# Patient Record
Sex: Male | Born: 1940 | Race: White | Hispanic: No | Marital: Married | Smoking: Never smoker
Health system: Southern US, Community
[De-identification: ages and names within clinical notes are randomized; demographics above are authoritative.]

## PROBLEM LIST (undated history)

## (undated) DIAGNOSIS — N189 Chronic kidney disease, unspecified: Secondary | ICD-10-CM

## (undated) DIAGNOSIS — M109 Gout, unspecified: Secondary | ICD-10-CM

## (undated) DIAGNOSIS — Z950 Presence of cardiac pacemaker: Secondary | ICD-10-CM

## (undated) DIAGNOSIS — E119 Type 2 diabetes mellitus without complications: Secondary | ICD-10-CM

## (undated) DIAGNOSIS — G9340 Encephalopathy, unspecified: Secondary | ICD-10-CM

## (undated) DIAGNOSIS — I519 Heart disease, unspecified: Secondary | ICD-10-CM

## (undated) HISTORY — PX: APPENDECTOMY: SHX54

## (undated) HISTORY — PX: TONSILLECTOMY: SUR1361

## (undated) HISTORY — PX: CARDIOVERSION: SHX1299

## (undated) HISTORY — PX: PACEMAKER PLACEMENT: SHX43

## (undated) HISTORY — PX: TOTAL HIP ARTHROPLASTY: SHX124

---

## 2018-06-20 ENCOUNTER — Emergency Department: Payer: Medicare Other

## 2018-06-20 ENCOUNTER — Encounter: Payer: Self-pay | Admitting: Emergency Medicine

## 2018-06-20 ENCOUNTER — Emergency Department
Admission: EM | Admit: 2018-06-20 | Discharge: 2018-06-20 | Disposition: A | Discharge status: Rehabilitation Facility | Payer: Medicare Other | Source: Home / Self Care | Attending: Emergency Medicine | Admitting: Emergency Medicine

## 2018-06-20 ENCOUNTER — Other Ambulatory Visit: Payer: Self-pay

## 2018-06-20 DIAGNOSIS — W06XXXA Fall from bed, initial encounter: Secondary | ICD-10-CM

## 2018-06-20 DIAGNOSIS — I129 Hypertensive chronic kidney disease with stage 1 through stage 4 chronic kidney disease, or unspecified chronic kidney disease: Secondary | ICD-10-CM | POA: Insufficient documentation

## 2018-06-20 DIAGNOSIS — Z950 Presence of cardiac pacemaker: Secondary | ICD-10-CM

## 2018-06-20 DIAGNOSIS — M545 Low back pain, unspecified: Secondary | ICD-10-CM

## 2018-06-20 DIAGNOSIS — Y939 Activity, unspecified: Secondary | ICD-10-CM

## 2018-06-20 DIAGNOSIS — Z23 Encounter for immunization: Secondary | ICD-10-CM | POA: Insufficient documentation

## 2018-06-20 DIAGNOSIS — S7002XA Contusion of left hip, initial encounter: Secondary | ICD-10-CM | POA: Insufficient documentation

## 2018-06-20 DIAGNOSIS — Y929 Unspecified place or not applicable: Secondary | ICD-10-CM

## 2018-06-20 DIAGNOSIS — Y999 Unspecified external cause status: Secondary | ICD-10-CM | POA: Insufficient documentation

## 2018-06-20 DIAGNOSIS — Z7901 Long term (current) use of anticoagulants: Secondary | ICD-10-CM

## 2018-06-20 DIAGNOSIS — Z96649 Presence of unspecified artificial hip joint: Secondary | ICD-10-CM

## 2018-06-20 DIAGNOSIS — W19XXXA Unspecified fall, initial encounter: Secondary | ICD-10-CM

## 2018-06-20 DIAGNOSIS — G9340 Encephalopathy, unspecified: Secondary | ICD-10-CM

## 2018-06-20 DIAGNOSIS — E1122 Type 2 diabetes mellitus with diabetic chronic kidney disease: Secondary | ICD-10-CM

## 2018-06-20 DIAGNOSIS — K7290 Hepatic failure, unspecified without coma: Secondary | ICD-10-CM | POA: Diagnosis not present

## 2018-06-20 DIAGNOSIS — N189 Chronic kidney disease, unspecified: Secondary | ICD-10-CM

## 2018-06-20 HISTORY — DX: Heart disease, unspecified: I51.9

## 2018-06-20 HISTORY — DX: Type 2 diabetes mellitus without complications: E11.9

## 2018-06-20 HISTORY — DX: Gout, unspecified: M10.9

## 2018-06-20 HISTORY — DX: Encephalopathy, unspecified: G93.40

## 2018-06-20 HISTORY — DX: Chronic kidney disease, unspecified: N18.9

## 2018-06-20 HISTORY — DX: Presence of cardiac pacemaker: Z95.0

## 2018-06-20 MED ORDER — TETANUS-DIPHTH-ACELL PERTUSSIS 5-2.5-18.5 LF-MCG/0.5 IM SUSP
0.5000 mL | Freq: Once | INTRAMUSCULAR | Status: AC
  Administered 2018-06-20: 0.5 mL via INTRAMUSCULAR
  Filled 2018-06-20: qty 0.5

## 2018-06-20 NOTE — ED Provider Notes (Signed)
[facility] Emergency Department Provider Note  ____________________________________________   First MD Initiated Contact with Patient 06/20/18 1739     (approximate)  I have reviewed the triage vital signs and the nursing notes.   HISTORY  Chief Complaint Fall   HPI Leonard Welch is a 77 y.o. male with a history of atrial fibrillation on Coumadin who was presented to emergency department after fall.  He says that he was positioning himself on his bed when he slipped off the end of the mattress falling backwards into a door.  Says that he hit his left lower back as well as his right elbow.  Denies hitting his head but patient does take Eliquis.  Denies loss of consciousness.  Does not report any neck pain.  Patient states that he is due for tetanus shot.   Past Medical History:  Diagnosis Date  . Cardiac pacemaker   . CKD (chronic kidney disease)   . Diabetes mellitus without complication (HCC)   . Encephalopathy   . Gout   . Heart disease     There are no active problems to display for this patient.   Past Surgical History:  Procedure Laterality Date  . APPENDECTOMY    . CARDIOVERSION    . PACEMAKER PLACEMENT    . TONSILLECTOMY    . TOTAL HIP ARTHROPLASTY Right     Prior to Admission medications   Not on File    Allergies Amiodarone and Warfarin and related  History reviewed. No pertinent family history.  Social History Social History   Tobacco Use  . Smoking status: Never Smoker  . Smokeless tobacco: Never Used  Substance Use Topics  . Alcohol use: Yes  . Drug use: Never    Review of Systems  Constitutional: No fever/chills Eyes: No visual changes. ENT: No sore throat. Cardiovascular: Denies chest pain. Respiratory: Denies shortness of breath. Gastrointestinal: No abdominal pain.  No nausea, no vomiting.  No diarrhea.  No constipation. Genitourinary: Negative for dysuria. Musculoskeletal: As above Skin: Negative for  rash. Neurological: Negative for headaches, focal weakness or numbness.   ____________________________________________   PHYSICAL EXAM:  VITAL SIGNS: ED Triage Vitals  Enc Vitals Group     BP 06/20/18 1742 116/79     Pulse Rate 06/20/18 1742 98     Resp 06/20/18 1742 17     Temp 06/20/18 1742 97.7 F (36.5 C)     Temp Source 06/20/18 1742 Oral     SpO2 06/20/18 1742 98 %     Weight 06/20/18 1744 222 lb (100.7 kg)     Height 06/20/18 1744 5\' 10"  (1.778 m)     Head Circumference --      Peak Flow --      Pain Score 06/20/18 1743 8     Pain Loc --      Pain Edu? --      Excl. in GC? --     Constitutional: Alert and oriented. Well appearing and in no acute distress. Eyes: Conjunctivae are normal.  Head: Atraumatic. Nose: No congestion/rhinnorhea. Mouth/Throat: Mucous membranes are moist.  Neck: No stridor.  Patient without any tenderness palpation of the midline cervical spine.  No deformity or step-off. Cardiovascular: Normal rate, regular rhythm. Grossly normal heart sounds.  Respiratory: Normal respiratory effort.  No retractions. Lungs CTAB. Gastrointestinal: Soft and nontender. No distention. No CVA tenderness. Musculoskeletal: Mild tenderness to palpation on the posterior lateral aspect of the iliac crest on the left side.  However, there  is no midline lumbar nor thoracic tenderness to palpation.  No deformity or step-off.  Pelvis is stable.  5 out of 5 strength in bilateral lower extremities.  No hip tenderness the bilateral extremities.  No deformities.  Moderate bilateral lower extremity edema which the patient says is chronic with multiple areas of abrasion which patient says is also chronic.  Right elbow with full, active, range of motion without any swelling or tenderness to palpation.  Mild abrasion to the right lateral dorsum of the elbow without any acute/active bleeding.  No surrounding erythema. Neurologic:  Normal speech and language. No gross focal neurologic  deficits are appreciated. Skin:  Skin is warm, dry and intact. No rash noted. Psychiatric: Mood and affect are normal. Speech and behavior are normal.  ____________________________________________   LABS (all labs ordered are listed, but only abnormal results are displayed)  Labs Reviewed - No data to display ____________________________________________  EKG  ED ECG REPORT I, Arelia Longest, the attending physician, personally viewed and interpreted this ECG.   Date: 06/20/2018  EKG Time: 1741  Rate: 96  Rhythm: atrial fibrillation, rate 96  Axis: Right  Intervals:none  ST&T Change: No ST segment elevation or depression.  T wave inversions in 2, 3 and aVF as well as V5 and V6. No significant change from previous. ____________________________________________  RADIOLOGY  No acute finding of the CT head.  Possible subcapital left-sided hip fracture on x-ray.  However, CT of the hip only shows arthritis. ____________________________________________   PROCEDURES  Procedure(s) performed:   Procedures  Critical Care performed:   ____________________________________________   INITIAL IMPRESSION / ASSESSMENT AND PLAN / ED COURSE  Pertinent labs & imaging results that were available during my care of the patient were reviewed by me and considered in my medical decision making (see chart for details).  DDX: Hip contusion, hip fracture, abrasion, elbow contusion, laceration, intracranial hemorrhage, skull fracture, concussion, pelvic fracture No previous records on file for review.  ----------------------------------------- 8:56 PM on 06/20/2018 -----------------------------------------  Patient at this time hemodynamically stable with pulse rate that is slightly decreased as well as a normal blood pressure.  Unlikely to have internal bleeding.  Reassuring imaging.  Patient to be discharged at this time.  Updated his wife as well as the patient regarding the diagnosis  and treatment plan. ____________________________________________   FINAL CLINICAL IMPRESSION(S) / ED DIAGNOSES  Fall.  Low back pain.  Hip contusion.  NEW MEDICATIONS STARTED DURING THIS VISIT:  New Prescriptions   No medications on file     Note:  This document was prepared using Dragon voice recognition software and may include unintentional dictation errors.     Myrna Blazer, MD 06/20/18 2056

## 2018-06-20 NOTE — ED Notes (Signed)
Patient transported to CT 

## 2018-06-20 NOTE — ED Notes (Signed)
Signature pad not working.  Unable to obtain patient discharge signature but verbalizes understanding and has no further questions.

## 2018-06-20 NOTE — ED Triage Notes (Signed)
Pt to ED via EMS from [facility] c/o unwitnessed fall today, patient states was transferring from wheelchair to bed when the wheelchair went backwards causing patient to fall into a door hitting his back and left hip.  Denies hitting head or LOC, presents A&Ox4, skin tears to bilateral arms.  EMS vitals 111/44 BP, 90 HR, 183 CBG.

## 2018-06-20 NOTE — ED Notes (Signed)
Pt given water 

## 2018-06-20 NOTE — ED Notes (Signed)
Patient's bilateral arms wrapped in non-adherent dressing and guaze.

## 2018-06-21 ENCOUNTER — Encounter: Payer: Self-pay | Admitting: Emergency Medicine

## 2018-06-21 ENCOUNTER — Inpatient Hospital Stay: Payer: Medicare Other

## 2018-06-21 ENCOUNTER — Inpatient Hospital Stay
Admission: EM | Admit: 2018-06-21 | Discharge: 2018-06-23 | DRG: 442 | Disposition: A | Discharge status: Other Acute Inpatient Hospital | Payer: Medicare Other | Attending: Internal Medicine | Admitting: Internal Medicine

## 2018-06-21 ENCOUNTER — Other Ambulatory Visit: Payer: Self-pay

## 2018-06-21 ENCOUNTER — Emergency Department: Payer: Medicare Other

## 2018-06-21 DIAGNOSIS — E1165 Type 2 diabetes mellitus with hyperglycemia: Secondary | ICD-10-CM | POA: Diagnosis present

## 2018-06-21 DIAGNOSIS — Z96641 Presence of right artificial hip joint: Secondary | ICD-10-CM | POA: Diagnosis present

## 2018-06-21 DIAGNOSIS — R29898 Other symptoms and signs involving the musculoskeletal system: Secondary | ICD-10-CM | POA: Diagnosis present

## 2018-06-21 DIAGNOSIS — Z515 Encounter for palliative care: Secondary | ICD-10-CM

## 2018-06-21 DIAGNOSIS — W06XXXA Fall from bed, initial encounter: Secondary | ICD-10-CM | POA: Diagnosis present

## 2018-06-21 DIAGNOSIS — E860 Dehydration: Secondary | ICD-10-CM | POA: Diagnosis present

## 2018-06-21 DIAGNOSIS — H53462 Homonymous bilateral field defects, left side: Secondary | ICD-10-CM | POA: Diagnosis present

## 2018-06-21 DIAGNOSIS — Z23 Encounter for immunization: Secondary | ICD-10-CM

## 2018-06-21 DIAGNOSIS — I69354 Hemiplegia and hemiparesis following cerebral infarction affecting left non-dominant side: Secondary | ICD-10-CM

## 2018-06-21 DIAGNOSIS — Z79899 Other long term (current) drug therapy: Secondary | ICD-10-CM

## 2018-06-21 DIAGNOSIS — E871 Hypo-osmolality and hyponatremia: Secondary | ICD-10-CM | POA: Diagnosis present

## 2018-06-21 DIAGNOSIS — Z7901 Long term (current) use of anticoagulants: Secondary | ICD-10-CM | POA: Diagnosis not present

## 2018-06-21 DIAGNOSIS — I4891 Unspecified atrial fibrillation: Secondary | ICD-10-CM | POA: Diagnosis present

## 2018-06-21 DIAGNOSIS — S0181XA Laceration without foreign body of other part of head, initial encounter: Secondary | ICD-10-CM | POA: Diagnosis present

## 2018-06-21 DIAGNOSIS — R627 Adult failure to thrive: Secondary | ICD-10-CM | POA: Diagnosis not present

## 2018-06-21 DIAGNOSIS — Y92092 Bedroom in other non-institutional residence as the place of occurrence of the external cause: Secondary | ICD-10-CM | POA: Diagnosis not present

## 2018-06-21 DIAGNOSIS — Z66 Do not resuscitate: Secondary | ICD-10-CM | POA: Diagnosis not present

## 2018-06-21 DIAGNOSIS — R4182 Altered mental status, unspecified: Secondary | ICD-10-CM

## 2018-06-21 DIAGNOSIS — I130 Hypertensive heart and chronic kidney disease with heart failure and stage 1 through stage 4 chronic kidney disease, or unspecified chronic kidney disease: Secondary | ICD-10-CM | POA: Diagnosis present

## 2018-06-21 DIAGNOSIS — I5042 Chronic combined systolic (congestive) and diastolic (congestive) heart failure: Secondary | ICD-10-CM | POA: Diagnosis present

## 2018-06-21 DIAGNOSIS — R410 Disorientation, unspecified: Secondary | ICD-10-CM | POA: Diagnosis not present

## 2018-06-21 DIAGNOSIS — K7290 Hepatic failure, unspecified without coma: Secondary | ICD-10-CM | POA: Diagnosis present

## 2018-06-21 DIAGNOSIS — I2510 Atherosclerotic heart disease of native coronary artery without angina pectoris: Secondary | ICD-10-CM | POA: Diagnosis present

## 2018-06-21 DIAGNOSIS — Z79890 Hormone replacement therapy (postmenopausal): Secondary | ICD-10-CM

## 2018-06-21 DIAGNOSIS — N183 Chronic kidney disease, stage 3 (moderate): Secondary | ICD-10-CM | POA: Diagnosis present

## 2018-06-21 DIAGNOSIS — Z950 Presence of cardiac pacemaker: Secondary | ICD-10-CM

## 2018-06-21 DIAGNOSIS — N179 Acute kidney failure, unspecified: Secondary | ICD-10-CM | POA: Diagnosis present

## 2018-06-21 DIAGNOSIS — R0989 Other specified symptoms and signs involving the circulatory and respiratory systems: Secondary | ICD-10-CM

## 2018-06-21 DIAGNOSIS — E1122 Type 2 diabetes mellitus with diabetic chronic kidney disease: Secondary | ICD-10-CM | POA: Diagnosis present

## 2018-06-21 DIAGNOSIS — Z794 Long term (current) use of insulin: Secondary | ICD-10-CM | POA: Diagnosis not present

## 2018-06-21 DIAGNOSIS — E039 Hypothyroidism, unspecified: Secondary | ICD-10-CM | POA: Diagnosis present

## 2018-06-21 DIAGNOSIS — K7682 Hepatic encephalopathy: Secondary | ICD-10-CM

## 2018-06-21 DIAGNOSIS — E878 Other disorders of electrolyte and fluid balance, not elsewhere classified: Secondary | ICD-10-CM | POA: Diagnosis present

## 2018-06-21 DIAGNOSIS — R531 Weakness: Secondary | ICD-10-CM

## 2018-06-21 LAB — PROTIME-INR
INR: 1.33
PROTHROMBIN TIME: 16.4 s — AB (ref 11.4–15.2)

## 2018-06-21 LAB — GLUCOSE, CAPILLARY
GLUCOSE-CAPILLARY: 136 mg/dL — AB (ref 70–99)
Glucose-Capillary: 152 mg/dL — ABNORMAL HIGH (ref 70–99)
Glucose-Capillary: 127 mg/dL — ABNORMAL HIGH (ref 70–99)
Glucose-Capillary: 147 mg/dL — ABNORMAL HIGH (ref 70–99)

## 2018-06-21 LAB — MRSA PCR SCREENING: MRSA by PCR: POSITIVE — AB

## 2018-06-21 LAB — COMPREHENSIVE METABOLIC PANEL
ALT: 21 U/L (ref 0–44)
ANION GAP: 14 (ref 5–15)
AST: 45 U/L — ABNORMAL HIGH (ref 15–41)
Albumin: 3.6 g/dL (ref 3.5–5.0)
Alkaline Phosphatase: 224 U/L — ABNORMAL HIGH (ref 38–126)
BUN: 32 mg/dL — ABNORMAL HIGH (ref 8–23)
CALCIUM: 10 mg/dL (ref 8.9–10.3)
CHLORIDE: 97 mmol/L — AB (ref 98–111)
CO2: 23 mmol/L (ref 22–32)
CREATININE: 2.14 mg/dL — AB (ref 0.61–1.24)
GFR calc non Af Amer: 28 mL/min — ABNORMAL LOW (ref >60)
GFR, EST AFRICAN AMERICAN: 33 mL/min — AB (ref >60)
Glucose, Bld: 176 mg/dL — ABNORMAL HIGH (ref 70–99)
Potassium: 4.6 mmol/L (ref 3.5–5.1)
SODIUM: 134 mmol/L — AB (ref 135–145)
Total Bilirubin: 2.2 mg/dL — ABNORMAL HIGH (ref 0.3–1.2)
Total Protein: 7.9 g/dL (ref 6.5–8.1)

## 2018-06-21 LAB — URINALYSIS, COMPLETE (UACMP) WITH MICROSCOPIC
BILIRUBIN URINE: NEGATIVE
Glucose, UA: NEGATIVE mg/dL
Ketones, ur: NEGATIVE mg/dL
Nitrite: NEGATIVE
Protein, ur: NEGATIVE mg/dL
SPECIFIC GRAVITY, URINE: 1.013 (ref 1.005–1.030)
pH: 5 (ref 5.0–8.0)

## 2018-06-21 LAB — VITAMIN B12: VITAMIN B 12: 528 pg/mL (ref 180–914)

## 2018-06-21 LAB — CBC
HCT: 44.2 % (ref 40.0–52.0)
Hemoglobin: 15.3 g/dL (ref 13.0–18.0)
MCH: 32.4 pg (ref 26.0–34.0)
MCHC: 34.7 g/dL (ref 32.0–36.0)
MCV: 93.3 fL (ref 80.0–100.0)
PLATELETS: 201 10*3/uL (ref 150–440)
RBC: 4.73 MIL/uL (ref 4.40–5.90)
RDW: 16.9 % — ABNORMAL HIGH (ref 11.5–14.5)
WBC: 10.1 10*3/uL (ref 3.8–10.6)

## 2018-06-21 LAB — PHOSPHORUS: PHOSPHORUS: 2.9 mg/dL (ref 2.5–4.6)

## 2018-06-21 LAB — MAGNESIUM: MAGNESIUM: 2.2 mg/dL (ref 1.7–2.4)

## 2018-06-21 LAB — AMMONIA: Ammonia: 36 umol/L — ABNORMAL HIGH (ref 9–35)

## 2018-06-21 LAB — APTT: aPTT: 41 seconds — ABNORMAL HIGH (ref 24–36)

## 2018-06-21 LAB — FOLATE: FOLATE: 10.3 ng/mL (ref >5.9)

## 2018-06-21 LAB — TSH: TSH: 1.108 u[IU]/mL (ref 0.350–4.500)

## 2018-06-21 MED ORDER — SENNOSIDES-DOCUSATE SODIUM 8.6-50 MG PO TABS: 1.0000 | ORAL_TABLET | Freq: Every evening | ORAL | Status: DC | PRN

## 2018-06-21 MED ORDER — ONDANSETRON HCL 4 MG PO TABS: 4.0000 mg | ORAL_TABLET | Freq: Four times a day (QID) | ORAL | Status: DC | PRN

## 2018-06-21 MED ORDER — LORAZEPAM 2 MG/ML IJ SOLN
1.0000 mg | Freq: Once | INTRAMUSCULAR | Status: AC
  Administered 2018-06-21: 1 mg via INTRAVENOUS

## 2018-06-21 MED ORDER — BACITRACIN 500 UNIT/GM EX OINT
TOPICAL_OINTMENT | Freq: Two times a day (BID) | CUTANEOUS | Status: DC
  Filled 2018-06-21 (×7): qty 14

## 2018-06-21 MED ORDER — ONDANSETRON HCL 4 MG/2ML IJ SOLN: 4.0000 mg | Freq: Four times a day (QID) | INTRAMUSCULAR | Status: DC | PRN

## 2018-06-21 MED ORDER — SPIRONOLACTONE 25 MG PO TABS
25.0000 mg | ORAL_TABLET | Freq: Every day | ORAL | Status: DC
  Administered 2018-06-21 – 2018-06-22 (×2): 25 mg via ORAL
  Filled 2018-06-21 (×2): qty 1

## 2018-06-21 MED ORDER — LACTULOSE 10 GM/15ML PO SOLN
30.0000 g | Freq: Three times a day (TID) | ORAL | Status: DC
  Administered 2018-06-21 – 2018-06-22 (×6): 30 g via ORAL
  Filled 2018-06-21 (×6): qty 60

## 2018-06-21 MED ORDER — INSULIN ASPART 100 UNIT/ML ~~LOC~~ SOLN: 0.0000 [IU] | Freq: Every day | SUBCUTANEOUS | Status: DC

## 2018-06-21 MED ORDER — INSULIN GLARGINE 100 UNIT/ML ~~LOC~~ SOLN
18.0000 [IU] | Freq: Every day | SUBCUTANEOUS | Status: DC
  Administered 2018-06-21 – 2018-06-22 (×2): 18 [IU] via SUBCUTANEOUS
  Filled 2018-06-21 (×2): qty 0.18

## 2018-06-21 MED ORDER — MORPHINE SULFATE (PF) 2 MG/ML IV SOLN
2.0000 mg | INTRAVENOUS | Status: DC | PRN
  Administered 2018-06-21 – 2018-06-22 (×5): 2 mg via INTRAVENOUS
  Filled 2018-06-21 (×6): qty 1

## 2018-06-21 MED ORDER — SODIUM CHLORIDE 0.9 % IV BOLUS
1000.0000 mL | Freq: Once | INTRAVENOUS | Status: AC
  Administered 2018-06-21: 1000 mL via INTRAVENOUS

## 2018-06-21 MED ORDER — APIXABAN 2.5 MG PO TABS
2.5000 mg | ORAL_TABLET | Freq: Two times a day (BID) | ORAL | Status: DC
  Administered 2018-06-21 – 2018-06-22 (×3): 2.5 mg via ORAL
  Filled 2018-06-21 (×3): qty 1

## 2018-06-21 MED ORDER — LORAZEPAM 2 MG/ML IJ SOLN
INTRAMUSCULAR | Status: AC
  Administered 2018-06-21: 1 mg via INTRAVENOUS
  Filled 2018-06-21: qty 1

## 2018-06-21 MED ORDER — BUMETANIDE 1 MG PO TABS
2.0000 mg | ORAL_TABLET | Freq: Every day | ORAL | Status: DC
  Administered 2018-06-21 – 2018-06-22 (×2): 2 mg via ORAL
  Filled 2018-06-21 (×3): qty 2

## 2018-06-21 MED ORDER — LACTULOSE 10 GM/15ML PO SOLN
30.0000 g | Freq: Once | ORAL | Status: DC
  Filled 2018-06-21: qty 60

## 2018-06-21 MED ORDER — METOPROLOL TARTRATE 25 MG PO TABS
25.0000 mg | ORAL_TABLET | Freq: Two times a day (BID) | ORAL | Status: DC
  Administered 2018-06-21 – 2018-06-22 (×3): 25 mg via ORAL
  Filled 2018-06-21 (×4): qty 1

## 2018-06-21 MED ORDER — LEVOTHYROXINE SODIUM 50 MCG PO TABS
175.0000 ug | ORAL_TABLET | Freq: Every day | ORAL | Status: DC
  Administered 2018-06-21 – 2018-06-22 (×2): 175 ug via ORAL
  Filled 2018-06-21 (×2): qty 1

## 2018-06-21 MED ORDER — INFLUENZA VAC SPLIT HIGH-DOSE 0.5 ML IM SUSY
0.5000 mL | PREFILLED_SYRINGE | INTRAMUSCULAR | Status: AC
  Administered 2018-06-22: 0.5 mL via INTRAMUSCULAR
  Filled 2018-06-21: qty 0.5

## 2018-06-21 MED ORDER — POLYETHYLENE GLYCOL 3350 17 G PO PACK
17.0000 g | PACK | Freq: Every day | ORAL | Status: DC
  Administered 2018-06-22: 17 g via ORAL
  Filled 2018-06-21: qty 1

## 2018-06-21 MED ORDER — INSULIN ASPART 100 UNIT/ML ~~LOC~~ SOLN
0.0000 [IU] | Freq: Three times a day (TID) | SUBCUTANEOUS | Status: DC
  Administered 2018-06-21: 3 [IU] via SUBCUTANEOUS
  Administered 2018-06-21: 2 [IU] via SUBCUTANEOUS
  Administered 2018-06-22: 3 [IU] via SUBCUTANEOUS
  Filled 2018-06-21 (×3): qty 1

## 2018-06-21 MED ORDER — SULFAMETHOXAZOLE-TRIMETHOPRIM 800-160 MG PO TABS
1.0000 | ORAL_TABLET | Freq: Two times a day (BID) | ORAL | Status: DC
  Administered 2018-06-21 – 2018-06-22 (×3): 1 via ORAL
  Filled 2018-06-21 (×5): qty 1

## 2018-06-21 MED ORDER — BISACODYL 5 MG PO TBEC: 5.0000 mg | DELAYED_RELEASE_TABLET | Freq: Every day | ORAL | Status: DC | PRN

## 2018-06-21 MED ORDER — PRAVASTATIN SODIUM 20 MG PO TABS
10.0000 mg | ORAL_TABLET | Freq: Every day | ORAL | Status: DC
  Administered 2018-06-22: 10 mg via ORAL
  Filled 2018-06-21 (×2): qty 1

## 2018-06-21 NOTE — ED Notes (Signed)
This RN to bedside to collect PT/INR. Admitting MD at bedside with patient's wife. Pt continues to be disoriented at this time. Pt repeatedly calling out, "move me over, help me Doc". Pt remains disoriented x 4, pt with dried blood noted to nose and forehead at this time.

## 2018-06-21 NOTE — Progress Notes (Signed)
[employer] [facility] [place] [facility]   PATIENT NAME: Leonard Welch    MR#:  [id number]  DATE OF BIRTH:  1941-04-09  SUBJECTIVE:  CHIEF COMPLAINT:   Chief Complaint  Patient presents with  . Fall  . Altered Mental Status  . Laceration   For last few months patient had multiple admissions at [facility] due to altered mental status and weakness issues with at one point he was suspected to have high ammonia and hepatic encephalopathy but although infection work-up and liver work-ups were negative and patient's symptoms persisted an MRI had reported having some old strokes.  At that and they were not able to find any clear diagnosis as per patient's wife who is present in the room.  They did sent him to the assisted living place nearby where he was more confused and had a fall and sent to our emergency room last night. Patient is completely confused and as per wife he is not able to move both upper limbs now. REVIEW OF SYSTEMS:   Due to confusion patient is not able to give review of system.  ROS  DRUG ALLERGIES:   Allergies  Allergen Reactions  . Amiodarone   . Warfarin And Related     VITALS:  Blood pressure 113/72, pulse 82, temperature 98.5 F (36.9 C), temperature source Oral, resp. rate 18, height 5\' 10"  (1.778 m), weight 100.6 kg, SpO2 97 %.  PHYSICAL EXAMINATION:   GENERAL:  77 y.o.-year-old patient lying in the bed very ill-appearing with multiple skin lesions and lacerations of different age.  EYES: Pupils equal, round, reactive to light and accommodation. No scleral icterus. Extraocular muscles intact.  HEENT: Head have laceration on forehead, normocephalic. Oropharynx and nasopharynx clear.  NECK:  Supple, no jugular venous distention. No thyroid enlargement, no tenderness.  LUNGS: Normal breath sounds bilaterally, no wheezing, rales,rhonchi or crepitation. No use of accessory muscles of respiration.  CARDIOVASCULAR: S1, S2 normal. No murmurs, rubs,  or gallops.  ABDOMEN: Soft, nontender, nondistended. Bowel sounds present. No organomegaly or mass.  EXTREMITIES: No pedal edema, bilateral toe redness/ cyanosis, palpable pulses. NEUROLOGIC: Patient is disoriented, moves both lower extremities up to 3 out of 5 power, his both upper extremities are weak with 0 out of 5 power and no response to painful stimuli.  He is awake and looking at me and speaking some words with no meaning and not communicating or following any commands. PSYCHIATRIC: The patient is confused and disoriented.  SKIN: Multiple injury marks, lacerations of different age and timeframe.   Physical Exam LABORATORY PANEL:   CBC Recent Labs  Lab 06/21/18 0525  WBC 10.1  HGB 15.3  HCT 44.2  PLT 201   ------------------------------------------------------------------------------------------------------------------  Chemistries  Recent Labs  Lab 06/21/18 0525 06/21/18 0727  NA 134*  --   K 4.6  --   CL 97*  --   CO2 23  --   GLUCOSE 176*  --   BUN 32*  --   CREATININE 2.14*  --   CALCIUM 10.0  --   MG  --  2.2  AST 45*  --   ALT 21  --   ALKPHOS 224*  --   BILITOT 2.2*  --    ------------------------------------------------------------------------------------------------------------------  Cardiac Enzymes No results for input(s): TROPONINI in the last 168 hours. ------------------------------------------------------------------------------------------------------------------  RADIOLOGY:  Dg Chest 1 View  Result Date: 06/21/2018 CLINICAL DATA:  Acute mental status changes with confusion. Indwelling cardiac pacemaker. Current history of chronic kidney  disease and diabetes. EXAM: Portable CHEST 1 VIEW COMPARISON:  None. FINDINGS: Cardiac silhouette markedly enlarged even allowing for AP portable technique. Thoracic aorta atherosclerotic. Hilar and mediastinal contours otherwise unremarkable. LEFT subclavian single lead transvenous pacemaker intact. Mild  pulmonary venous hypertension without overt edema. Elevation of the LEFT hemidiaphragm with mild atelectasis at the LEFT lung base. Lungs otherwise clear. No visible pleural effusions. IMPRESSION: 1. Mild LEFT basilar atelectasis with elevation of the LEFT hemidiaphragm. No acute cardiopulmonary disease otherwise. 2. Marked cardiomegaly. Mild pulmonary venous hypertension without evidence of overt pulmonary edema currently. Electronically Signed   By: Hulan Saas M.D.   On: 06/21/2018 13:45   Ct Head Wo Contrast  Result Date: 06/21/2018 CLINICAL DATA:  77 y/o M; fall with laceration to the head, confusion, altered mental status. EXAM: CT HEAD WITHOUT CONTRAST CT CERVICAL SPINE WITHOUT CONTRAST TECHNIQUE: Multidetector CT imaging of the head and cervical spine was performed following the standard protocol without intravenous contrast. Multiplanar CT image reconstructions of the cervical spine were also generated. COMPARISON:  06/20/2018 CT head. FINDINGS: CT HEAD FINDINGS Brain: No evidence of acute infarction, hemorrhage, hydrocephalus, extra-axial collection or mass lesion/mass effect. Right occipital lobe chronic infarction. Very small chronic infarction in right frontal white matter. Moderate volume loss of the brain. Vascular: Calcific atherosclerosis of carotid siphons. Skull: Mild left frontal scalp contusion. No calvarial fracture identified. Sinuses/Orbits: No acute finding. Other: None. CT CERVICAL SPINE FINDINGS Alignment: Normal. Skull base and vertebrae: No acute fracture. No primary bone lesion or focal pathologic process. Soft tissues and spinal canal: Postsurgical changes within the left anterior neck. Disc levels: Multilevel disc and facet degenerative changes prominent endplate marginal osteophytes greatest at C6-7. Uncovertebral and facet hypertrophy results in bilateral C3-4, left C4-5, bilateral C5-6, bilateral C6-7 foraminal stenosis. Multilevel mild to moderate canal stenosis. Upper  chest: Calcified granulomas in the lung apices. Other: Negative. IMPRESSION: 1. Mild left frontal scalp contusion. No calvarial fracture. 2. No acute intracranial abnormality identified. 3. No acute fracture or dislocation of the cervical spine identified. 4. 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 5. Moderate cervical spondylosis greatest at C6-7 level. Electronically Signed   By: Mitzi Hansen M.D.   On: 06/21/2018 05:56   Ct Head Wo Contrast  Result Date: 06/20/2018 CLINICAL DATA:  Unwitnessed fall, trauma EXAM: CT HEAD WITHOUT CONTRAST TECHNIQUE: Contiguous axial images were obtained from the base of the skull through the vertex without intravenous contrast. COMPARISON:  None. FINDINGS: Brain: Brain atrophy noted with chronic white matter microvascular changes throughout both cerebral hemispheres. Medial right occipital lobe encephalomalacia posteriorly compatible with a previous right PCA territory infarct. No acute intracranial hemorrhage, new mass lesion, new infarction, midline shift, herniation, hydrocephalus, or extra-axial fluid collection. No focal mass effect or edema. Cisterns are patent. Cerebellar atrophy as well. Vascular: Intracranial atherosclerosis noted.  No hyperdense vessel. Skull: Normal. Negative for fracture or focal lesion. Sinuses/Orbits: No acute finding. Other: None. IMPRESSION: Atrophy and chronic white matter microvascular changes. Remote right occipital PCA territory infarct No acute intracranial abnormality by noncontrast CT. Electronically Signed   By: Judie Petit.  Shick M.D.   On: 06/20/2018 18:51   Ct Cervical Spine Wo Contrast  Result Date: 06/21/2018 CLINICAL DATA:  77 y/o M; fall with laceration to the head, confusion, altered mental status. EXAM: CT HEAD WITHOUT CONTRAST CT CERVICAL SPINE WITHOUT CONTRAST TECHNIQUE: Multidetector CT imaging of the head and cervical spine was performed following the standard protocol without  intravenous contrast.  Multiplanar CT image reconstructions of the cervical spine were also generated. COMPARISON:  06/20/2018 CT head. FINDINGS: CT HEAD FINDINGS Brain: No evidence of acute infarction, hemorrhage, hydrocephalus, extra-axial collection or mass lesion/mass effect. Right occipital lobe chronic infarction. Very small chronic infarction in right frontal white matter. Moderate volume loss of the brain. Vascular: Calcific atherosclerosis of carotid siphons. Skull: Mild left frontal scalp contusion. No calvarial fracture identified. Sinuses/Orbits: No acute finding. Other: None. CT CERVICAL SPINE FINDINGS Alignment: Normal. Skull base and vertebrae: No acute fracture. No primary bone lesion or focal pathologic process. Soft tissues and spinal canal: Postsurgical changes within the left anterior neck. Disc levels: Multilevel disc and facet degenerative changes prominent endplate marginal osteophytes greatest at C6-7. Uncovertebral and facet hypertrophy results in bilateral C3-4, left C4-5, bilateral C5-6, bilateral C6-7 foraminal stenosis. Multilevel mild to moderate canal stenosis. Upper chest: Calcified granulomas in the lung apices. Other: Negative. IMPRESSION: 1. Mild left frontal scalp contusion. No calvarial fracture. 2. No acute intracranial abnormality identified. 3. No acute fracture or dislocation of the cervical spine identified. 4. 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 5. Moderate cervical spondylosis greatest at C6-7 level. Electronically Signed   By: Mitzi Hansen M.D.   On: 06/21/2018 05:56   Ct Hip Left Wo Contrast  Result Date: 06/20/2018 CLINICAL DATA:  Pt to ED via EMS from [facility] c/o unwitnessed fall today, patient states was transferring from wheelchair to bed when the wheelchair went backwards causing patient to fall into a door hitting his back and left hip. Denies hitting head or LOC EXAM: CT OF THE LEFT HIP WITHOUT  CONTRAST TECHNIQUE: Multidetector CT imaging of the left hip was performed according to the standard protocol. Multiplanar CT image reconstructions were also generated. COMPARISON:  Current left hip radiographs. FINDINGS: Bones/Joint/Cartilage No fracture. There is a small collar of osteophytes along the base of the left femoral head accounting for the impression of a possible fracture on the standard radiographs. No bone lesion. Left hip joint is normally aligned. There is concentric hip joint space narrowing with greater narrowing along superolateral joint space. No convincing joint effusion. Ligaments Suboptimally assessed by CT. Muscles and Tendons The muscles and tendons inserting along the proximal appear intact. No muscle contusion or hematoma on the included field. Soft tissues No soft tissue mass or significant contusion or hematoma. No acute findings noted in the visualized portions of the left pelvis. IMPRESSION: 1. No fracture. Specifically, no evidence of a left femoral neck fracture. No acute findings. 2. Degenerative/arthropathic changes of the left hip joint with a small collar osteophytes along the base of the femoral head. Electronically Signed   By: Amie Portland M.D.   On: 06/20/2018 19:54   Dg Hip Unilat W Or Wo Pelvis 2-3 Views Left  Result Date: 06/20/2018 CLINICAL DATA:  Unwitnessed fall today while transferring from the wheelchair to bed with left hip pain. EXAM: DG HIP (WITH OR WITHOUT PELVIS) 2-3V LEFT COMPARISON:  None. FINDINGS: Mild diffuse osteopenia. Mild degenerative change of the left hip. There are subtle findings suggesting a subcapital left femoral neck fracture there are degenerative changes of the spine. Visualized portions of the right hip arthroplasty are intact. IMPRESSION: Subtle findings suggesting left subcapital femoral neck fracture. CT or MRI may be helpful for confirmation. Electronically Signed   By: Elberta Fortis M.D.   On: 06/20/2018 19:02    ASSESSMENT AND  PLAN:   Active Problems:   Hepatic encephalopathy (HCC)  Palliative care by specialist   DNR (do not resuscitate)   Adult failure to thrive  *Acute encephalopathy Likely hepatic encephalopathy Also could be worsening of his dementia or a new stroke  CT scan of the head and cervical spine is negative for any acute internal injuries, had some superficial laceration noted. Patient had some old strokes. Currently he is confused so will not cooperate with MRI.  Also doing an MRI may not help with changing the plan of management.  I had this discussion with the wife and she agrees. We will try to rule out infections.  X-ray chest is negative. We will collect urinalysis.  For borderline ammonia- on lactulose. With new onset bilateral upper limb weakness (as per wife and admitting doctor's note he was trying to pull out the lines when he presented to emergency room) I would do a repeat CT scan of the head as he is on anticoagulation. Also send B12, folate, RPR to rule out other pathologies.  Palliative care consult for recurrent admissions and worsening in condition. I had discussed with patient's wife, if he does not improve much we will have to focus on getting him to the rehab center with palliative care following there.  She also had a concern about transferring him to [facility]. As [facility] had admitted the patient twice in the last few month without much findings, and there would not be any new inputs-I had discussed with wife that he may end up staying on the waiting list and may actually not go there as they would not be new things they might do there which we are not doing here.  *Hyponatremia IV fluids.  *Acute renal failure on CKD stage III Baseline creatinine appears around 1.5 Avoid nephrotoxic medications and monitor tomorrow.  *Chronic systolic congestive heart failure Ejection fraction 35% Currently monitor, no signs of exacerbation at this time.  *Atrial fibrillation On Eliquis  currently.  *Hypothyroidism TSH normal.  All the records are reviewed and case discussed with Care Management/Social Workerr. Management plans discussed with the patient, family and they are in agreement.  CODE STATUS: DNR  TOTAL TIME TAKING CARE OF THIS PATIENT:45  minutes.     POSSIBLE D/C IN 2-3 DAYS, DEPENDING ON CLINICAL CONDITION.   Altamese Dilling M.D on 06/21/2018   Between 7am to 6pm - Pager - [phone]  After 6pm go to [email or web address] - password [email or web address]  [facility]  Office  [phone]  CC: Primary care physician; Barnett Hatter, MD  Note: This dictation was prepared with Dragon dictation along with smaller phrase technology. Any transcriptional errors that result from this process are unintentional.

## 2018-06-21 NOTE — Consult Note (Signed)
WOC Nurse wound consult note Reason for Consult:Bilateral LEs with chronic (>3 month) wounds of extended duration with exacerbation from fall. New wounds on forehead and nose from fall. Wound type: trauma, other LE etiology not known; numerous lesions (circular, dry and scabbed) on bilateral legs and arms. Pressure Injury POA: NA Measurement: Right anterior LE with full thickness wound measuring 3cm x 3.5cm x 0.1cm with moist, pink wound bed. No drainage Left LE with dried serum (scab) obscuring wound bed measuring 7cm x 2cm. Dry. Forehead with partial thickness tissue loss in a 7cm x 4cm area with two lesions each 0.1cm deep. Nose: right bridge measures 2cm x 0.1cm with dried serum obscuring wound bed. Wound bed:As described above Drainage (amount, consistency, odor) As described above Periwound: intact, with lesions as noted above Dressing procedure/placement/frequency: Patients family member described that wounds on legs are dressed daily with a nonadherent gauze dressing. We will implement that care twice daily due to exacerbations related to fall. I will provide pressure redistribution heel boots due to the contestant movement of patients legs and feet due to neuropathy. Conservative orders are placed for care of the facial wounds using xeroform gauze to the forehead and bacitracin ointment to the nose.  Patient is in a low bed for safety.  WOC nursing team will not follow, but will remain available to this patient, the nursing and medical teams.  Please re-consult if needed. Thanks, Ladona Mow, MSN, RN, GNP, [facility]  Pager# [phone]

## 2018-06-21 NOTE — Plan of Care (Signed)

## 2018-06-21 NOTE — ED Notes (Signed)
Attempted to call report at this time, RN unable to take report 

## 2018-06-21 NOTE — ED Notes (Signed)
Call from Mayra, EDT in CT with pt due to pt unable to stay still for scan. Pt not following commands. See [facility] for MD intervention.

## 2018-06-21 NOTE — Consult Note (Signed)
Consultation Note Date: 06/21/2018   Patient Name: Leonard Welch  DOB: 1941/02/26  MRN: [id number]  Age / Sex: 77 y.o., male  PCP: Barnett Hatter, MD Referring Physician: Altamese Dilling, *  Reason for Consultation: Establishing goals of care and Psychosocial/spiritual support  HPI/Patient Profile: 77 y.o. male  admitted on 06/21/2018 with past medical h/o of ESLD, chronic  CHF (EF 35% w/ grade II diastolic dysfxn), Afib (Eliquis), T2IDDM, CVA, Med[employer] PPM, who p/w AMS, fall w/ facial laceration.  Admitted for evaluation and treatment.    Pt is currently agitated and restless.  His wife is  at bedside, states that pt has had neurocognitive decline since 07/2017.  She understands the seriousness of the situation and long term poor prognosis.  Wife reports that the patient has been in and out of facilities for past  5 months.    Family face treatment option decisions, advanced directive decisions and anticipatory car needs.    Clinical Assessment and Goals of Care:  This NP Lorinda Creed reviewed medical records, received report from team, assessed the patient and then meet at [name]  to discuss diagnosis, prognosis, GOC, EOL wishes disposition and options.  Concept of Hospice and Palliative Care were discussed.    A detailed discussion was had today regarding advanced directives.  Concepts specific to code status, artifical feeding and hydration, continued IV antibiotics and rehospitalization was had.  The difference between a aggressive medical intervention path  and a palliative comfort care path for this patient at this time was had.  Values and goals of care important to patient and family were attempted to be elicited.  Family has actually been in contact with a Palliative provider in the past.  Comfort is a priority, however wife's main concern at  this time is finding placement for her husband closer to home in [facility] form completed, placed in hard chart   Natural trajectory and expectations at EOL were discussed.  Questions and concerns addressed.   Family encouraged to call with questions or concerns.    PMT will continue to support holistically.    HCPOA/wife    SUMMARY OF RECOMMENDATIONS    Code Status/Advance Care Planning:  DNR-documented today  Treat the treatable and when medically stable dc closer to home, discussed with SW  Open to hospice "when that time comes"-- discussed eligibility criteria    Palliative Prophylaxis:   Aspiration, Bowel Regimen, Delirium Protocol, Frequent Pain Assessment and Oral Care  Additional Recommendations (Limitations, Scope, Preferences):  No Artificial Feeding and No Hemodialysis  Psycho-social/Spiritual:   Desire for further Chaplaincy support:no/ declined  "we have our own preacher"   Additional Recommendations: Education on Hospice  Prognosis:   Unable to determine- dependant on desire for life prolonging measures  Discharge Planning: To Be Determined      Primary Diagnoses: Present on Admission: . Hepatic encephalopathy (HCC)   I have reviewed the medical record, interviewed the patient and family, and examined the patient. The following aspects  are pertinent.  Past Medical History:  Diagnosis Date  . Cardiac pacemaker   . CKD (chronic kidney disease)   . Diabetes mellitus without complication (HCC)   . Encephalopathy   . Gout   . Heart disease    Social History   Socioeconomic History  . Marital status: Married    Spouse name: Not on file  . Number of children: Not on file  . Years of education: Not on file  . Highest education level: Not on file  Occupational History  . Not on file  Social Needs  . Financial resource strain: Not on file  . Food insecurity:    Worry: Not on file    Inability: Not on file  . Transportation needs:     Medical: Not on file    Non-medical: Not on file  Tobacco Use  . Smoking status: Never Smoker  . Smokeless tobacco: Never Used  Substance and Sexual Activity  . Alcohol use: Yes  . Drug use: Never  . Sexual activity: Not on file  Lifestyle  . Physical activity:    Days per week: Not on file    Minutes per session: Not on file  . Stress: Not on file  Relationships  . Social connections:    Talks on phone: Not on file    Gets together: Not on file    Attends religious service: Not on file    Active member of club or organization: Not on file    Attends meetings of clubs or organizations: Not on file    Relationship status: Not on file  Other Topics Concern  . Not on file  Social History Narrative  . Not on file   History reviewed. No pertinent family history. Scheduled Meds: . apixaban  2.5 mg Oral BID  . bumetanide  2 mg Oral Daily  . [START ON 06/22/2018] Influenza vac split quadrivalent PF  0.5 mL Intramuscular Tomorrow-1000  . insulin aspart  0-15 Units Subcutaneous TID WC  . insulin aspart  0-5 Units Subcutaneous QHS  . insulin glargine  18 Units Subcutaneous QHS  . lactulose  30 g Oral TID  . lactulose  30 g Oral Once  . levothyroxine  175 mcg Oral QAC breakfast  . metoprolol tartrate  25 mg Oral BID  . polyethylene glycol  17 g Oral Daily  . pravastatin  10 mg Oral QHS  . spironolactone  25 mg Oral Daily  . sulfamethoxazole-trimethoprim  1 tablet Oral BID   Continuous Infusions: PRN Meds:.bisacodyl, morphine injection, ondansetron **OR** ondansetron (ZOFRAN) IV, senna-docusate Medications Prior to Admission:  Prior to Admission medications   Medication Sig Start Date End Date Taking? Authorizing Provider  acetaminophen (TYLENOL) 325 MG tablet Take 650 mg by mouth every 4 (four) hours as needed.   Yes [provider]  allopurinol (ZYLOPRIM) 300 MG tablet Take 300 mg by mouth daily.   Yes [provider]  apixaban (ELIQUIS) 2.5 MG TABS tablet  Take 2.5 mg by mouth 2 (two) times daily.   Yes [provider]  bumetanide (BUMEX) 1 MG tablet Take 1 mg by mouth daily as needed.   Yes [provider]  bumetanide (BUMEX) 2 MG tablet Take 2 mg by mouth daily.   Yes [provider]  docusate sodium (COLACE) 100 MG capsule Take 100 mg by mouth 2 (two) times daily.   Yes [provider]  glucosamine-chondroitin 500-400 MG tablet Take 1 tablet by mouth 2 (two) times daily.  Yes [provider]  insulin aspart (NOVOLOG) 100 UNIT/ML injection Inject 10 Units into the skin 3 (three) times daily with meals.   Yes [provider]  insulin glargine (LANTUS) 100 UNIT/ML injection Inject 18 Units into the skin at bedtime.   Yes [provider]  levothyroxine (SYNTHROID, LEVOTHROID) 175 MCG tablet Take 175 mcg by mouth daily before breakfast.   Yes [provider]  Melatonin 5 MG TABS Take 1 tablet by mouth at bedtime as needed.   Yes [provider]  metoprolol tartrate (LOPRESSOR) 25 MG tablet Take 25 mg by mouth 2 (two) times daily.   Yes [provider]  Multiple Vitamins-Minerals (CERTAVITE SENIOR/ANTIOXIDANT) TABS Take 1 tablet by mouth daily.   Yes [provider]  nystatin (NYSTATIN) powder Apply 1 g topically 2 (two) times daily.   Yes [provider]  polyethylene glycol (MIRALAX / GLYCOLAX) packet Take 17 g by mouth daily.   Yes [provider]  pravastatin (PRAVACHOL) 10 MG tablet Take 10 mg by mouth at bedtime.   Yes [provider]  senna (SENOKOT) 8.6 MG tablet Take 1 tablet by mouth 2 (two) times daily.   Yes [provider]  spironolactone (ALDACTONE) 25 MG tablet Take 25 mg by mouth daily.   Yes [provider]  sulfamethoxazole-trimethoprim (BACTRIM DS,SEPTRA DS) 800-160 MG tablet Take 1 tablet by mouth 2 (two) times daily.   Yes [provider]   Allergies  Allergen Reactions  .  Amiodarone   . Warfarin And Related    Review of Systems  Unable to perform ROS: Mental status change    Physical Exam  Constitutional: He appears well-developed.  HENT:  Head: Normocephalic. Head is with abrasion.  Cardiovascular: Tachycardia present.  Skin: Skin is warm and dry.  - noted facial abrasions 2/2 fall PTA  Psychiatric: He is agitated. Cognition and memory are impaired. He expresses impulsivity.  - confused and disoriented     Vital Signs: BP 120/73 (BP Location: Right Arm)   Pulse (!) 128   Temp 98.4 F (36.9 C) (Oral)   Resp 18   Ht 5\' 7"  (1.702 m)   Wt 100.6 kg   SpO2 96%   BMI 34.72 kg/m  Pain Scale: 0-10   Pain Score: 7    SpO2: SpO2: 96 % O2 Device:SpO2: 96 % O2 Flow Rate: .   IO: Intake/output summary: No intake or output data in the 24 hours ending 06/21/18 1108  LBM:   Baseline Weight: Weight: 100.7 kg Most recent weight: Weight: 100.6 kg      Palliative Assessment/Data: 30 % at best   Discussed with Dr Elisabeth Pigeon and Rhodia Albright NP will f/i tomorrow with wife for further conversation  Time In: 1115 Time Out: 1230 Time Total: 75 minutes Greater than 50%  of this time was spent counseling and coordinating care related to the above assessment and plan.  Signed by: Lorinda Creed, NP   Please contact Palliative Medicine Team phone at [phone] for questions and concerns.  For individual provider: See Loretha Stapler

## 2018-06-21 NOTE — ED Provider Notes (Signed)
[facility] Emergency Department Provider Note    First MD Initiated Contact with Patient 06/21/18 [phone]     (approximate)  I have reviewed the triage vital signs and the nursing notes.  L5 caveat: History of physical exam limited secondary to altered mental status. HISTORY  Chief Complaint Fall; Altered Mental Status; and Laceration    HPI Leonard Welch is a 77 y.o. male with below list of chronic medical conditions presents to the emergency department via [facility] [place] following anwitnessed fall. Patient was seen in the emergency department yesterday secondary to a fall with resultant head injury. Patientvery agitated and confused on arrival   Past Medical History:  Diagnosis Date  . Cardiac pacemaker   . CKD (chronic kidney disease)   . Diabetes mellitus without complication (HCC)   . Encephalopathy   . Gout   . Heart disease     There are no active problems to display for this patient.   Past Surgical History:  Procedure Laterality Date  . APPENDECTOMY    . CARDIOVERSION    . PACEMAKER PLACEMENT    . TONSILLECTOMY    . TOTAL HIP ARTHROPLASTY Right     Prior to Admission medications   Medication Sig Start Date End Date Taking? Authorizing Provider  acetaminophen (TYLENOL) 325 MG tablet Take 650 mg by mouth every 4 (four) hours as needed.   Yes [provider]  allopurinol (ZYLOPRIM) 300 MG tablet Take 300 mg by mouth daily.   Yes [provider]  apixaban (ELIQUIS) 2.5 MG TABS tablet Take 2.5 mg by mouth 2 (two) times daily.   Yes [provider]  bumetanide (BUMEX) 1 MG tablet Take 1 mg by mouth daily as needed.   Yes [provider]  bumetanide (BUMEX) 2 MG tablet Take 2 mg by mouth daily.   Yes [provider]  docusate sodium (COLACE) 100 MG capsule Take 100 mg by mouth 2 (two) times daily.   Yes [provider]  glucosamine-chondroitin 500-400 MG tablet Take 1 tablet by mouth 2  (two) times daily.   Yes [provider]  insulin aspart (NOVOLOG) 100 UNIT/ML injection Inject 10 Units into the skin 3 (three) times daily with meals.   Yes [provider]  insulin glargine (LANTUS) 100 UNIT/ML injection Inject 18 Units into the skin at bedtime.   Yes [provider]  levothyroxine (SYNTHROID, LEVOTHROID) 175 MCG tablet Take 175 mcg by mouth daily before breakfast.   Yes [provider]  Melatonin 5 MG TABS Take 1 tablet by mouth at bedtime as needed.   Yes [provider]  metoprolol tartrate (LOPRESSOR) 25 MG tablet Take 25 mg by mouth 2 (two) times daily.   Yes [provider]  Multiple Vitamins-Minerals (CERTAVITE SENIOR/ANTIOXIDANT) TABS Take 1 tablet by mouth daily.   Yes [provider]  nystatin (NYSTATIN) powder Apply 1 g topically 2 (two) times daily.   Yes [provider]  polyethylene glycol (MIRALAX / GLYCOLAX) packet Take 17 g by mouth daily.   Yes [provider]  pravastatin (PRAVACHOL) 10 MG tablet Take 10 mg by mouth at bedtime.   Yes [provider]  senna (SENOKOT) 8.6 MG tablet Take 1 tablet by mouth 2 (two) times daily.   Yes [provider]  spironolactone (ALDACTONE) 25 MG tablet Take 25 mg by mouth daily.   Yes [provider]  sulfamethoxazole-trimethoprim (BACTRIM DS,SEPTRA DS) 800-160 MG tablet Take 1 tablet by mouth 2 (  two) times daily.   Yes [provider]    Allergies Amiodarone and Warfarin and related  History reviewed. No pertinent family history.  Social History Social History   Tobacco Use  . Smoking status: Never Smoker  . Smokeless tobacco: Never Used  Substance Use Topics  . Alcohol use: Yes  . Drug use: Never    Review of Systems Constitutional: No fever/chills Eyes: No visual changes. ENT: No sore throat. Cardiovascular: Denies chest pain. Respiratory: Denies shortness of breath. Gastrointestinal: No  abdominal pain.  No nausea, no vomiting.  No diarrhea.  No constipation. Genitourinary: Negative for dysuria. Musculoskeletal: Negative for neck pain.  Negative for back pain. Integumentary: Negative for rash. Neurological: Negative for headaches, focal weakness or numbness. psychiatric: Positive for altered mental status ____________________________________________   PHYSICAL EXAM:  VITAL SIGNS: ED Triage Vitals  Enc Vitals Group     BP 06/21/18 0456 123/78     Pulse Rate 06/21/18 0456 (!) 110     Resp 06/21/18 0456 20     Temp 06/21/18 0456 98 F (36.7 C)     Temp Source 06/21/18 0456 Oral     SpO2 06/21/18 0456 97 %     Weight 06/21/18 0458 100.7 kg (222 lb)     Height 06/21/18 0458 1.778 m (5\' 10" )     Head Circumference --      Peak Flow --      Pain Score 06/21/18 0452 3     Pain Loc --      Pain Edu? --      Excl. in GC? --     Constitutional: Alert but confused. Agitated Eyes: Conjunctivae are normal. PERRL. EOMI. Head: Forehead abrasions noted. Mouth/Throat: Mucous membranes are moist.  Oropharynx non-erythematous. Neck: No stridor.   Cardiovascular: tachycardia, regular rhythm. Good peripheral circulation. Grossly normal heart sounds. Respiratory: Normal respiratory effort.  No retractions. Lungs CTAB. Gastrointestinal: Soft and nontender. No distention.  Musculoskeletal: No lower extremity tenderness nor edema. No gross deformities of extremities. Neurologic:  nonsensical speech. No gross focal neurologic deficits are appreciated.  Skin:  Skin is warm, dry and intact. No rash noted. Psychiatric: very restless with nonsensical speech.  ____________________________________________   LABS (all labs ordered are listed, but only abnormal results are displayed)  Labs Reviewed  CBC - Abnormal; Notable for the following components:      Result Value   RDW 16.9 (*)    All other components within normal limits  COMPREHENSIVE METABOLIC PANEL - Abnormal; Notable  for the following components:   Sodium 134 (*)    Chloride 97 (*)    Glucose, Bld 176 (*)    BUN 32 (*)    Creatinine, Ser 2.14 (*)    AST 45 (*)    Alkaline Phosphatase 224 (*)    Total Bilirubin 2.2 (*)    GFR calc non Af Amer 28 (*)    GFR calc Af Amer 33 (*)    All other components within normal limits  URINE CULTURE  URINALYSIS, COMPLETE (UACMP) WITH MICROSCOPIC  AMMONIA   ____________________________________________  EKG  ED ECG REPORT I, [place] N BROWN, the attending physician, personally viewed and interpreted this ECG.   Date: 06/21/2018  EKG Time: 4:50 AM  Rate: 130  Rhythm: sinus tachycardic  Axis: normal  Intervals:normal  ST&T Change: None  ____________________________________________  RADIOLOGY I, [place] N BROWN, personally viewed and evaluated these images (plain radiographs) as part of my medical decision making, as well as reviewing  the written report by the radiologist.  ED MD interpretation:  no acute intracranial abnormality.  Official radiology report(s): Ct Head Wo Contrast  Result Date: 06/21/2018 CLINICAL DATA:  77 y/o M; fall with laceration to the head, confusion, altered mental status. EXAM: CT HEAD WITHOUT CONTRAST CT CERVICAL SPINE WITHOUT CONTRAST TECHNIQUE: Multidetector CT imaging of the head and cervical spine was performed following the standard protocol without intravenous contrast. Multiplanar CT image reconstructions of the cervical spine were also generated. COMPARISON:  06/20/2018 CT head. FINDINGS: CT HEAD FINDINGS Brain: No evidence of acute infarction, hemorrhage, hydrocephalus, extra-axial collection or mass lesion/mass effect. Right occipital lobe chronic infarction. Very small chronic infarction in right frontal white matter. Moderate volume loss of the brain. Vascular: Calcific atherosclerosis of carotid siphons. Skull: Mild left frontal scalp contusion. No calvarial fracture identified. Sinuses/Orbits: No acute finding.  Other: None. CT CERVICAL SPINE FINDINGS Alignment: Normal. Skull base and vertebrae: No acute fracture. No primary bone lesion or focal pathologic process. Soft tissues and spinal canal: Postsurgical changes within the left anterior neck. Disc levels: Multilevel disc and facet degenerative changes prominent endplate marginal osteophytes greatest at C6-7. Uncovertebral and facet hypertrophy results in bilateral C3-4, left C4-5, bilateral C5-6, bilateral C6-7 foraminal stenosis. Multilevel mild to moderate canal stenosis. Upper chest: Calcified granulomas in the lung apices. Other: Negative. IMPRESSION: 1. Mild left frontal scalp contusion. No calvarial fracture. 2. No acute intracranial abnormality identified. 3. No acute fracture or dislocation of the cervical spine identified. 4. 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 5. Moderate cervical spondylosis greatest at C6-7 level. Electronically Signed   By: Mitzi Hansen M.D.   On: 06/21/2018 05:56   Ct Head Wo Contrast  Result Date: 06/20/2018 CLINICAL DATA:  Unwitnessed fall, trauma EXAM: CT HEAD WITHOUT CONTRAST TECHNIQUE: Contiguous axial images were obtained from the base of the skull through the vertex without intravenous contrast. COMPARISON:  None. FINDINGS: Brain: Brain atrophy noted with chronic white matter microvascular changes throughout both cerebral hemispheres. Medial right occipital lobe encephalomalacia posteriorly compatible with a previous right PCA territory infarct. No acute intracranial hemorrhage, new mass lesion, new infarction, midline shift, herniation, hydrocephalus, or extra-axial fluid collection. No focal mass effect or edema. Cisterns are patent. Cerebellar atrophy as well. Vascular: Intracranial atherosclerosis noted.  No hyperdense vessel. Skull: Normal. Negative for fracture or focal lesion. Sinuses/Orbits: No acute finding. Other: None. IMPRESSION: Atrophy and chronic white matter  microvascular changes. Remote right occipital PCA territory infarct No acute intracranial abnormality by noncontrast CT. Electronically Signed   By: Judie Petit.  Shick M.D.   On: 06/20/2018 18:51   Ct Cervical Spine Wo Contrast  Result Date: 06/21/2018 CLINICAL DATA:  77 y/o M; fall with laceration to the head, confusion, altered mental status. EXAM: CT HEAD WITHOUT CONTRAST CT CERVICAL SPINE WITHOUT CONTRAST TECHNIQUE: Multidetector CT imaging of the head and cervical spine was performed following the standard protocol without intravenous contrast. Multiplanar CT image reconstructions of the cervical spine were also generated. COMPARISON:  06/20/2018 CT head. FINDINGS: CT HEAD FINDINGS Brain: No evidence of acute infarction, hemorrhage, hydrocephalus, extra-axial collection or mass lesion/mass effect. Right occipital lobe chronic infarction. Very small chronic infarction in right frontal white matter. Moderate volume loss of the brain. Vascular: Calcific atherosclerosis of carotid siphons. Skull: Mild left frontal scalp contusion. No calvarial fracture identified. Sinuses/Orbits: No acute finding. Other: None. CT CERVICAL SPINE FINDINGS Alignment: Normal. Skull base and vertebrae: No acute fracture. No primary bone lesion or  focal pathologic process. Soft tissues and spinal canal: Postsurgical changes within the left anterior neck. Disc levels: Multilevel disc and facet degenerative changes prominent endplate marginal osteophytes greatest at C6-7. Uncovertebral and facet hypertrophy results in bilateral C3-4, left C4-5, bilateral C5-6, bilateral C6-7 foraminal stenosis. Multilevel mild to moderate canal stenosis. Upper chest: Calcified granulomas in the lung apices. Other: Negative. IMPRESSION: 1. Mild left frontal scalp contusion. No calvarial fracture. 2. No acute intracranial abnormality identified. 3. No acute fracture or dislocation of the cervical spine identified. 4. Chronic right occipital lobe infarction and  moderate volume loss of the brain. 5. Moderate cervical spondylosis greatest at C6-7 level. Electronically Signed   By: Mitzi Hansen M.D.   On: 06/21/2018 05:56   Ct Hip Left Wo Contrast  Result Date: 06/20/2018 CLINICAL DATA:  Pt to ED via EMS from [facility] c/o unwitnessed fall today, patient states was transferring from wheelchair to bed when the wheelchair went backwards causing patient to fall into a door hitting his back and left hip. Denies hitting head or LOC EXAM: CT OF THE LEFT HIP WITHOUT CONTRAST TECHNIQUE: Multidetector CT imaging of the left hip was performed according to the standard protocol. Multiplanar CT image reconstructions were also generated. COMPARISON:  Current left hip radiographs. FINDINGS: Bones/Joint/Cartilage No fracture. There is a small collar of osteophytes along the base of the left femoral head accounting for the impression of a possible fracture on the standard radiographs. No bone lesion. Left hip joint is normally aligned. There is concentric hip joint space narrowing with greater narrowing along superolateral joint space. No convincing joint effusion. Ligaments Suboptimally assessed by CT. Muscles and Tendons The muscles and tendons inserting along the proximal appear intact. No muscle contusion or hematoma on the included field. Soft tissues No soft tissue mass or significant contusion or hematoma. No acute findings noted in the visualized portions of the left pelvis. IMPRESSION: 1. No fracture. Specifically, no evidence of a left femoral neck fracture. No acute findings. 2. Degenerative/arthropathic changes of the left hip joint with a small collar osteophytes along the base of the femoral head. Electronically Signed   By: Amie Portland M.D.   On: 06/20/2018 19:54   Dg Hip Unilat W Or Wo Pelvis 2-3 Views Left  Result Date: 06/20/2018 CLINICAL DATA:  Unwitnessed fall today while transferring from the wheelchair to bed with left hip pain. EXAM: DG HIP  (WITH OR WITHOUT PELVIS) 2-3V LEFT COMPARISON:  None. FINDINGS: Mild diffuse osteopenia. Mild degenerative change of the left hip. There are subtle findings suggesting a subcapital left femoral neck fracture there are degenerative changes of the spine. Visualized portions of the right hip arthroplasty are intact. IMPRESSION: Subtle findings suggesting left subcapital femoral neck fracture. CT or MRI may be helpful for confirmation. Electronically Signed   By: Elberta Fortis M.D.   On: 06/20/2018 19:02     Procedures   ____________________________________________   INITIAL IMPRESSION / ASSESSMENT AND PLAN / ED COURSE  As part of my medical decision making, I reviewed the following data within the electronic MEDICAL RECORD NUMBER   77 year old male presenting with above-stated history and physical exam secondary to altered mental status.  Concern for possible hepatic encephalopathy given the fact that the patient's ammonia level was 119 while at [facility] and the patient's not currently taking lactulose.  CT scan of the head revealed no acute intracranial abnormality.  Patient discussed with Dr. Bosie Helper for hospital admission further evaluation and management for altered mental status and  possible hepatic encephalopathy  ____________________________________________  FINAL CLINICAL IMPRESSION(S) / ED DIAGNOSES  Final diagnoses:  Hepatic encephalopathy (HCC)  Altered mental status, unspecified altered mental status type     MEDICATIONS GIVEN DURING THIS VISIT:  Medications  sodium chloride 0.9 % bolus 1,000 mL (has no administration in time range)  LORazepam (ATIVAN) injection 1 mg (1 mg Intravenous Given 06/21/18 1610)     ED Discharge Orders    None       Note:  This document was prepared using Dragon voice recognition software and may include unintentional dictation errors.    Darci Current, MD 06/21/18 [phone]

## 2018-06-21 NOTE — ED Notes (Signed)
Pt went to CT

## 2018-06-21 NOTE — ED Triage Notes (Signed)
Pt arrived to the ED via EMS from [facility] for fall with laceration to the head and confusion/altered mental status. Pt is alert and able to answer questions with generalized weakness. Dr. Manson Passey at bedside upon arrival.

## 2018-06-21 NOTE — ED Notes (Signed)
[name] is at bedside and states that the Pt is displaying usual behavior/baseline behavior. [name] states that the Pt has moments where he has no deficiencies neurological speaking, able to do ADL's and moments where he has neurological deficiencies and has like today. MD was notified.

## 2018-06-21 NOTE — ED Notes (Signed)
Admitting doctor in room with patient and wife at this time.

## 2018-06-21 NOTE — H&P (Signed)
[employer] [place] at [facility]   PATIENT NAME: Leonard Welch    MR#:  [id number]  DATE OF BIRTH:  03-May-1941  DATE OF ADMISSION:  06/21/2018  PRIMARY CARE PHYSICIAN: Barnett Hatter, MD   REQUESTING/REFERRING PHYSICIAN: Darci Current, MD  CHIEF COMPLAINT:   Chief Complaint  Patient presents with  . Fall  . Altered Mental Status  . Laceration    HISTORY OF PRESENT ILLNESS:  Leonard Welch  is a 77 y.o. male with a known history of ESLD, chronic combined systolic + diastolic CHF (EF 95% w/ grade II diastolic dysfxn), Afib (Eliquis), T2IDDM, R CVA, MedTronic PPM, who p/w AMS, fall w/ facial laceration. Pt is altered, agitated, combative, restless, attempting to get out of bed, pulling at devices, interfering w/ medical staff and impeding clinical management. He cannot provide any Hx/ROS, and most of his verbiage is random and meaningless. His wife, at bedside, states that pt has had neurocognitive decline since 07/2017, and has been getting progressively more forgetful and stubborn. He has a great deal of medical comorbidity. He has been eating and drinking less. Hospitalizations have become more frequent, occurring as often as once every two months or so. His wife says she visited him in the morning at the [facility] facility where he presently stays. She says he was in good health, well-appearing, and lucid. She says they played Materials engineer (card game). She states she stay until lunch, and then left. She received a call from the facility @~1700PM stating the pt had fallen, and was to be sent to the ED for evaluation. There was concern for hip fracture and head injury, but trauma series CT imaging performed in the ED was (-) acute fracture/dislocation. [name] tells me he was lucid at the time, and was discharged back to [facility]. [name] received another phone call from [facility] @~0400AM on Wednesday 06/21/2018, informing her that pt had been found  face-down on the floor, unconscious and altered. EMS was called again. At the time of assessment, pt is AAOx1 (to self only) and confused. He continues to interfere with care and pull at lines/tubes/devices. Hx ESLD/hepatic encephalopathy, though Ammonia is only 36 on present admission. CT head (-) acute intracranial abnl.  PAST MEDICAL HISTORY:   Past Medical History:  Diagnosis Date  . Cardiac pacemaker   . CKD (chronic kidney disease)   . Diabetes mellitus without complication (HCC)   . Encephalopathy   . Gout   . Heart disease     PAST SURGICAL HISTORY:   Past Surgical History:  Procedure Laterality Date  . APPENDECTOMY    . CARDIOVERSION    . PACEMAKER PLACEMENT    . TONSILLECTOMY    . TOTAL HIP ARTHROPLASTY Right     SOCIAL HISTORY:   Social History   Tobacco Use  . Smoking status: Never Smoker  . Smokeless tobacco: Never Used  Substance Use Topics  . Alcohol use: Yes    FAMILY HISTORY:  History reviewed. No pertinent family history.  DRUG ALLERGIES:   Allergies  Allergen Reactions  . Amiodarone   . Warfarin And Related     REVIEW OF SYSTEMS:   Review of Systems  Unable to perform ROS: Mental status change  Gastrointestinal: Positive for nausea.  Musculoskeletal: Positive for back pain and myalgias.   MEDICATIONS AT HOME:   Prior to Admission medications   Medication Sig Start Date End Date Taking? Authorizing Provider  acetaminophen (TYLENOL) 325 MG tablet  Take 650 mg by mouth every 4 (four) hours as needed.   Yes [provider]  allopurinol (ZYLOPRIM) 300 MG tablet Take 300 mg by mouth daily.   Yes [provider]  apixaban (ELIQUIS) 2.5 MG TABS tablet Take 2.5 mg by mouth 2 (two) times daily.   Yes [provider]  bumetanide (BUMEX) 1 MG tablet Take 1 mg by mouth daily as needed.   Yes [provider]  bumetanide (BUMEX) 2 MG tablet Take 2 mg by mouth daily.   Yes [provider]  docusate sodium  (COLACE) 100 MG capsule Take 100 mg by mouth 2 (two) times daily.   Yes [provider]  glucosamine-chondroitin 500-400 MG tablet Take 1 tablet by mouth 2 (two) times daily.   Yes [provider]  insulin aspart (NOVOLOG) 100 UNIT/ML injection Inject 10 Units into the skin 3 (three) times daily with meals.   Yes [provider]  insulin glargine (LANTUS) 100 UNIT/ML injection Inject 18 Units into the skin at bedtime.   Yes [provider]  levothyroxine (SYNTHROID, LEVOTHROID) 175 MCG tablet Take 175 mcg by mouth daily before breakfast.   Yes [provider]  Melatonin 5 MG TABS Take 1 tablet by mouth at bedtime as needed.   Yes [provider]  metoprolol tartrate (LOPRESSOR) 25 MG tablet Take 25 mg by mouth 2 (two) times daily.   Yes [provider]  Multiple Vitamins-Minerals (CERTAVITE SENIOR/ANTIOXIDANT) TABS Take 1 tablet by mouth daily.   Yes [provider]  nystatin (NYSTATIN) powder Apply 1 g topically 2 (two) times daily.   Yes [provider]  polyethylene glycol (MIRALAX / GLYCOLAX) packet Take 17 g by mouth daily.   Yes [provider]  pravastatin (PRAVACHOL) 10 MG tablet Take 10 mg by mouth at bedtime.   Yes [provider]  senna (SENOKOT) 8.6 MG tablet Take 1 tablet by mouth 2 (two) times daily.   Yes [provider]  spironolactone (ALDACTONE) 25 MG tablet Take 25 mg by mouth daily.   Yes [provider]  sulfamethoxazole-trimethoprim (BACTRIM DS,SEPTRA DS) 800-160 MG tablet Take 1 tablet by mouth 2 (two) times daily.   Yes [provider]      VITAL SIGNS:  Blood pressure 120/73, pulse (!) 128, temperature 98.4 F (36.9 C), temperature source Oral, resp. rate 18, height 5\' 7"  (1.702 m), weight 100.6 kg, SpO2 96 %.  PHYSICAL EXAMINATION:  Physical Exam  Constitutional: He appears well-developed and well-nourished. He is active. He appears toxic. He  has a sickly appearance. He appears ill. He appears distressed. He is not intubated.  HENT:  Head: Head is with laceration.  Mouth/Throat: Oropharynx is clear and moist. No oropharyngeal exudate.  Eyes: Conjunctivae, EOM and lids are normal. No scleral icterus.  Neck: Neck supple. No JVD present. No thyromegaly present.  Cardiovascular: Regular rhythm, S1 normal, S2 normal and normal heart sounds.  No extrasystoles are present. Tachycardia present. Exam reveals no gallop, no S3, no S4, no distant heart sounds and no friction rub.  No murmur heard. Pulmonary/Chest: Effort normal. No accessory muscle usage or stridor. No apnea, no tachypnea and no bradypnea. He is not intubated. No respiratory distress. He has decreased breath sounds in the right upper field, the right middle field, the right lower field, the left upper field, the left middle field and the left lower field. He has no wheezes. He has no rhonchi. He has no rales.  Abdominal:  Soft. He exhibits no distension. Bowel sounds are decreased. There is no tenderness. There is no rigidity, no rebound and no guarding.  Musculoskeletal: Normal range of motion. He exhibits no edema or tenderness.  Neurological: He is alert. He is disoriented.  Skin: Skin is warm. He is diaphoretic. There is erythema.  Psychiatric: His mood appears anxious. His affect is angry, blunt, labile and inappropriate. His speech is delayed and tangential. His speech is not rapid and/or pressured. He is agitated, aggressive and combative. He is not hyperactive, not slowed, not withdrawn and not actively hallucinating. Cognition and memory are impaired. He does not exhibit a depressed mood. He is communicative. He is inattentive.   LABORATORY PANEL:   CBC Recent Labs  Lab 06/21/18 0525  WBC 10.1  HGB 15.3  HCT 44.2  PLT 201   ------------------------------------------------------------------------------------------------------------------  Chemistries  Recent Labs    Lab 06/21/18 0525 06/21/18 0727  NA 134*  --   K 4.6  --   CL 97*  --   CO2 23  --   GLUCOSE 176*  --   BUN 32*  --   CREATININE 2.14*  --   CALCIUM 10.0  --   MG  --  2.2  AST 45*  --   ALT 21  --   ALKPHOS 224*  --   BILITOT 2.2*  --    ------------------------------------------------------------------------------------------------------------------  Cardiac Enzymes No results for input(s): TROPONINI in the last 168 hours. ------------------------------------------------------------------------------------------------------------------  RADIOLOGY:  Ct Head Wo Contrast  Result Date: 06/21/2018 CLINICAL DATA:  77 y/o M; fall with laceration to the head, confusion, altered mental status. EXAM: CT HEAD WITHOUT CONTRAST CT CERVICAL SPINE WITHOUT CONTRAST TECHNIQUE: Multidetector CT imaging of the head and cervical spine was performed following the standard protocol without intravenous contrast. Multiplanar CT image reconstructions of the cervical spine were also generated. COMPARISON:  06/20/2018 CT head. FINDINGS: CT HEAD FINDINGS Brain: No evidence of acute infarction, hemorrhage, hydrocephalus, extra-axial collection or mass lesion/mass effect. Right occipital lobe chronic infarction. Very small chronic infarction in right frontal white matter. Moderate volume loss of the brain. Vascular: Calcific atherosclerosis of carotid siphons. Skull: Mild left frontal scalp contusion. No calvarial fracture identified. Sinuses/Orbits: No acute finding. Other: None. CT CERVICAL SPINE FINDINGS Alignment: Normal. Skull base and vertebrae: No acute fracture. No primary bone lesion or focal pathologic process. Soft tissues and spinal canal: Postsurgical changes within the left anterior neck. Disc levels: Multilevel disc and facet degenerative changes prominent endplate marginal osteophytes greatest at C6-7. Uncovertebral and facet hypertrophy results in bilateral C3-4, left C4-5, bilateral C5-6, bilateral  C6-7 foraminal stenosis. Multilevel mild to moderate canal stenosis. Upper chest: Calcified granulomas in the lung apices. Other: Negative. IMPRESSION: 1. Mild left frontal scalp contusion. No calvarial fracture. 2. No acute intracranial abnormality identified. 3. No acute fracture or dislocation of the cervical spine identified. 4. 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 5. Moderate cervical spondylosis greatest at C6-7 level. Electronically Signed   By: Mitzi Hansen M.D.   On: 06/21/2018 05:56   Ct Head Wo Contrast  Result Date: 06/20/2018 CLINICAL DATA:  Unwitnessed fall, trauma EXAM: CT HEAD WITHOUT CONTRAST TECHNIQUE: Contiguous axial images were obtained from the base of the skull through the vertex without intravenous contrast. COMPARISON:  None. FINDINGS: Brain: Brain atrophy noted with chronic white matter microvascular changes throughout both cerebral hemispheres. Medial right occipital lobe encephalomalacia posteriorly compatible with a previous right PCA territory infarct. No acute  intracranial hemorrhage, new mass lesion, new infarction, midline shift, herniation, hydrocephalus, or extra-axial fluid collection. No focal mass effect or edema. Cisterns are patent. Cerebellar atrophy as well. Vascular: Intracranial atherosclerosis noted.  No hyperdense vessel. Skull: Normal. Negative for fracture or focal lesion. Sinuses/Orbits: No acute finding. Other: None. IMPRESSION: Atrophy and chronic white matter microvascular changes. Remote right occipital PCA territory infarct No acute intracranial abnormality by noncontrast CT. Electronically Signed   By: Judie Petit.  Shick M.D.   On: 06/20/2018 18:51   Ct Cervical Spine Wo Contrast  Result Date: 06/21/2018 CLINICAL DATA:  77 y/o M; fall with laceration to the head, confusion, altered mental status. EXAM: CT HEAD WITHOUT CONTRAST CT CERVICAL SPINE WITHOUT CONTRAST TECHNIQUE: Multidetector CT imaging of the head  and cervical spine was performed following the standard protocol without intravenous contrast. Multiplanar CT image reconstructions of the cervical spine were also generated. COMPARISON:  06/20/2018 CT head. FINDINGS: CT HEAD FINDINGS Brain: No evidence of acute infarction, hemorrhage, hydrocephalus, extra-axial collection or mass lesion/mass effect. Right occipital lobe chronic infarction. Very small chronic infarction in right frontal white matter. Moderate volume loss of the brain. Vascular: Calcific atherosclerosis of carotid siphons. Skull: Mild left frontal scalp contusion. No calvarial fracture identified. Sinuses/Orbits: No acute finding. Other: None. CT CERVICAL SPINE FINDINGS Alignment: Normal. Skull base and vertebrae: No acute fracture. No primary bone lesion or focal pathologic process. Soft tissues and spinal canal: Postsurgical changes within the left anterior neck. Disc levels: Multilevel disc and facet degenerative changes prominent endplate marginal osteophytes greatest at C6-7. Uncovertebral and facet hypertrophy results in bilateral C3-4, left C4-5, bilateral C5-6, bilateral C6-7 foraminal stenosis. Multilevel mild to moderate canal stenosis. Upper chest: Calcified granulomas in the lung apices. Other: Negative. IMPRESSION: 1. Mild left frontal scalp contusion. No calvarial fracture. 2. No acute intracranial abnormality identified. 3. No acute fracture or dislocation of the cervical spine identified. 4. 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 5. Moderate cervical spondylosis greatest at C6-7 level. Electronically Signed   By: Mitzi Hansen M.D.   On: 06/21/2018 05:56   Ct Hip Left Wo Contrast  Result Date: 06/20/2018 CLINICAL DATA:  Pt to ED via EMS from [facility] c/o unwitnessed fall today, patient states was transferring from wheelchair to bed when the wheelchair went backwards causing patient to fall into a door hitting his back and left  hip. Denies hitting head or LOC EXAM: CT OF THE LEFT HIP WITHOUT CONTRAST TECHNIQUE: Multidetector CT imaging of the left hip was performed according to the standard protocol. Multiplanar CT image reconstructions were also generated. COMPARISON:  Current left hip radiographs. FINDINGS: Bones/Joint/Cartilage No fracture. There is a small collar of osteophytes along the base of the left femoral head accounting for the impression of a possible fracture on the standard radiographs. No bone lesion. Left hip joint is normally aligned. There is concentric hip joint space narrowing with greater narrowing along superolateral joint space. No convincing joint effusion. Ligaments Suboptimally assessed by CT. Muscles and Tendons The muscles and tendons inserting along the proximal appear intact. No muscle contusion or hematoma on the included field. Soft tissues No soft tissue mass or significant contusion or hematoma. No acute findings noted in the visualized portions of the left pelvis. IMPRESSION: 1. No fracture. Specifically, no evidence of a left femoral neck fracture. No acute findings. 2. Degenerative/arthropathic changes of the left hip joint with a small collar osteophytes along the base of the femoral head. Electronically Signed  By: Amie Portland M.D.   On: 06/20/2018 19:54   Dg Hip Unilat W Or Wo Pelvis 2-3 Views Left  Result Date: 06/20/2018 CLINICAL DATA:  Unwitnessed fall today while transferring from the wheelchair to bed with left hip pain. EXAM: DG HIP (WITH OR WITHOUT PELVIS) 2-3V LEFT COMPARISON:  None. FINDINGS: Mild diffuse osteopenia. Mild degenerative change of the left hip. There are subtle findings suggesting a subcapital left femoral neck fracture there are degenerative changes of the spine. Visualized portions of the right hip arthroplasty are intact. IMPRESSION: Subtle findings suggesting left subcapital femoral neck fracture. CT or MRI may be helpful for confirmation. Electronically Signed    By: Elberta Fortis M.D.   On: 06/20/2018 19:02   IMPRESSION AND PLAN:   A/P: 16X p/w AMS, suspected hepatic encephalopathy. Hypochloremic hyponatremia, dehydration, hyperglycemia (w/ T2IDDM), azotemia, AKI on CKD, transaminasemia, hyperbilirubinemia, coagulopathy (elevated INR, elevated APTT). -AMS, suspected hepatic encephalopathy, transaminasemia, hyperbilirubinemia, coagulopathy: AAOx1 at the time of my assessment. Very agitated, pulling at tubes/lines/devices, yelling a lot. He has to be constantly calmed and redirected. Ammonia 36. CT head (-) acute intracranial abnl. No other clear source of AMS. Will start lactulose and monitor for improvement. Other lab findings, including transaminasemia, hyperbilirubinemia and coagulopathy likely represent the constellation of findings typically seen in an individual with end-stage liver disease. May benefit from Rifaximin. TSH, B12, folate, RPR pending. -Hypochloremic hyponatremia, dehydration: EF 35%. Received IVF in ED. Not continued. -Hyperglycemia, T2IDDM: SSI, c/w Lantus. -Azotemia, AKI, CKD III: Cr 2.14 on present admission. Baseline Cr 1.5-1.6 based on review of previous labwork (most recent Cr 05/29/2018 was 1.67). Suspect prerenal AKI superimposed on CKD III (2/2 DM, HTN, aged kidney). AKI likely prerenal, possibly 2/2 septic vs. Cardiogenic shock, possible component of hypovolemia as well. EF 35%, hold further IVF. Monitor BMP, avoid nephrotoxins. -c/w other home meds. -FEN/GI: NPO, ADAT (diabetic renal diet w/ free water restriction). -DVT PPx: Eliquis. -Code status: Full code (for now). -Disposition: Admission, > 2 midnights.   All the records are reviewed and case discussed with ED provider. Management plans discussed with the patient, family and they are in agreement.  CODE STATUS: Full code.  TOTAL TIME TAKING CARE OF THIS PATIENT: 90 minutes.    Barbaraann Rondo M.D on 06/21/2018 at 9:38 AM  Between 7am to 6pm - Pager -  [phone]  After 6pm go to [email or web address] - password [email or web address] [facility]  [employer] [facility] [place] [facility]  Office  [phone]  CC: Primary care physician; Barnett Hatter, MD   Note: This dictation was prepared with Dragon dictation along with smaller phrase technology. Any transcriptional errors that result from this process are unintentional.

## 2018-06-22 ENCOUNTER — Inpatient Hospital Stay: Payer: Medicare Other

## 2018-06-22 ENCOUNTER — Encounter: Payer: Self-pay | Admitting: Radiology

## 2018-06-22 DIAGNOSIS — R531 Weakness: Secondary | ICD-10-CM

## 2018-06-22 DIAGNOSIS — R29898 Other symptoms and signs involving the musculoskeletal system: Secondary | ICD-10-CM | POA: Diagnosis present

## 2018-06-22 DIAGNOSIS — R410 Disorientation, unspecified: Secondary | ICD-10-CM

## 2018-06-22 LAB — GLUCOSE, CAPILLARY
GLUCOSE-CAPILLARY: 157 mg/dL — AB (ref 70–99)
Glucose-Capillary: 114 mg/dL — ABNORMAL HIGH (ref 70–99)
Glucose-Capillary: 168 mg/dL — ABNORMAL HIGH (ref 70–99)
Glucose-Capillary: 129 mg/dL — ABNORMAL HIGH (ref 70–99)

## 2018-06-22 LAB — BASIC METABOLIC PANEL
Anion gap: 8 (ref 5–15)
BUN: 31 mg/dL — AB (ref 8–23)
CALCIUM: 9.9 mg/dL (ref 8.9–10.3)
CHLORIDE: 106 mmol/L (ref 98–111)
CO2: 27 mmol/L (ref 22–32)
CREATININE: 1.9 mg/dL — AB (ref 0.61–1.24)
GFR calc Af Amer: 38 mL/min — ABNORMAL LOW (ref >60)
GFR calc non Af Amer: 33 mL/min — ABNORMAL LOW (ref >60)
Glucose, Bld: 154 mg/dL — ABNORMAL HIGH (ref 70–99)
Potassium: 3.3 mmol/L — ABNORMAL LOW (ref 3.5–5.1)
Sodium: 141 mmol/L (ref 135–145)

## 2018-06-22 LAB — AMMONIA: Ammonia: 11 umol/L (ref 9–35)

## 2018-06-22 LAB — RPR: RPR: NONREACTIVE

## 2018-06-22 MED ORDER — POTASSIUM CHLORIDE CRYS ER 20 MEQ PO TBCR
20.0000 meq | EXTENDED_RELEASE_TABLET | Freq: Two times a day (BID) | ORAL | Status: DC
  Administered 2018-06-22 (×2): 20 meq via ORAL
  Filled 2018-06-22 (×2): qty 1

## 2018-06-22 MED ORDER — SODIUM CHLORIDE 0.9% FLUSH
3.0000 mL | Freq: Two times a day (BID) | INTRAVENOUS | Status: DC
  Administered 2018-06-22: 3 mL via INTRAVENOUS

## 2018-06-22 MED ORDER — MUPIROCIN 2 % EX OINT
1.0000 "application " | TOPICAL_OINTMENT | Freq: Two times a day (BID) | CUTANEOUS | Status: DC
  Administered 2018-06-22: 1 via NASAL
  Filled 2018-06-22: qty 22

## 2018-06-22 MED ORDER — IOHEXOL 350 MG/ML SOLN
75.0000 mL | Freq: Once | INTRAVENOUS | Status: AC | PRN
  Administered 2018-06-22: 75 mL via INTRAVENOUS

## 2018-06-22 MED ORDER — CHLORHEXIDINE GLUCONATE CLOTH 2 % EX PADS: 6.0000 | MEDICATED_PAD | Freq: Every day | CUTANEOUS | Status: DC

## 2018-06-22 MED ORDER — LACTULOSE 10 GM/15ML PO SOLN: 30.0000 g | Freq: Two times a day (BID) | ORAL | 0 refills | Status: DC

## 2018-06-22 NOTE — Discharge Summary (Signed)
[facility]   PATIENT NAME: Leonard Welch    MR#:  [id number]  DATE OF BIRTH:  Mar 28, 1941  DATE OF ADMISSION:  06/21/2018 ADMITTING PHYSICIAN: Barbaraann Rondo, MD  DATE OF DISCHARGE: 06/22/2018   PRIMARY CARE PHYSICIAN: Barnett Hatter, MD    ADMISSION DIAGNOSIS:  Hepatic encephalopathy (HCC) [K72.90] Altered mental status, unspecified altered mental status type [R41.82]  DISCHARGE DIAGNOSIS:  Active Problems:   Hepatic encephalopathy (HCC)   Palliative care by specialist   DNR (do not resuscitate)   Adult failure to thrive   Confusion   Weakness generalized   SECONDARY DIAGNOSIS:   Past Medical History:  Diagnosis Date  . Cardiac pacemaker   . CKD (chronic kidney disease)   . Diabetes mellitus without complication (HCC)   . Encephalopathy   . Gout   . Heart disease     HOSPITAL COURSE:    *Acute encephalopathy Likely hepatic encephalopathy Also could be worsening of his dementia or a new stroke  CT scan of the head and cervical spine is negative for any acute internal injuries, had some superficial laceration noted. Patient had some old strokes.  try to rule out infections.  X-ray chest is negative. Negative urinalysis.  For borderline ammonia- on lactulose.  Ammonia level improved and patient is back to baseline mentally today. His TSH, vitamin B12, folate, RPR all negative.  Palliative care consult for recurrent admissions and worsening in condition. I had discussed with patient's wife, if he does not improve much we will have to focus on getting him to the rehab center with palliative care following there.  *Bilateral upper extremity weakness This is a new finding, CT head was done yesterday which was negative for any bleed. As it persisted, call neurology consult.  They suggested to repeat CT cervical spine and angiogram of chest as patient has weak pulses bilateral upper extremity also. He  will also need to have MRI of cervical spine which could not be done here because of his pacemaker. [facility] had done MRI in the past on him and they have accepted him for transfer for further care. However neurologist had also spoken to neurologist at [facility] should be [facility] or [facility] which I were should be sooner.  *Hyponatremia IV fluids.  *Acute renal failure on CKD stage III Baseline creatinine appears around 1.5 Avoid nephrotoxic medications and monitor tomorrow. Improving.  *Chronic systolic congestive heart failure Ejection fraction 35% Currently monitor, no signs of exacerbation at this time.  *Atrial fibrillation On Eliquis currently.  hold today as we do not know if he had bleed or any abnormalities in his vasculature causing this new weakness.  *Hypothyroidism TSH normal.   DISCHARGE CONDITIONS:   Stable.  CONSULTS OBTAINED:  Treatment Team:  Barbaraann Rondo, MD Thana Farr, MD  DRUG ALLERGIES:   Allergies  Allergen Reactions  . Amiodarone   . Warfarin And Related     DISCHARGE MEDICATIONS:   Allergies as of 06/22/2018      Reactions   Amiodarone    Warfarin And Related       Medication List    STOP taking these medications   ELIQUIS 2.5 MG Tabs tablet Generic drug:  apixaban     TAKE these medications   acetaminophen 325 MG tablet Commonly known as:  TYLENOL Take 650 mg by mouth every 4 (four) hours as needed.   allopurinol 300 MG tablet Commonly known as:  ZYLOPRIM Take 300 mg by mouth daily.   bumetanide 2 MG tablet Commonly known as:  BUMEX Take 2 mg by mouth daily. What changed:  Another medication with the same name was removed. Continue taking this medication, and follow the directions you see here.   CERTAVITE SENIOR/ANTIOXIDANT Tabs Take 1 tablet by mouth daily.   docusate sodium 100 MG capsule Commonly known as:  COLACE Take 100 mg by mouth 2 (two) times  daily.   glucosamine-chondroitin 500-400 MG tablet Take 1 tablet by mouth 2 (two) times daily.   insulin aspart 100 UNIT/ML injection Commonly known as:  novoLOG Inject 10 Units into the skin 3 (three) times daily with meals.   insulin glargine 100 UNIT/ML injection Commonly known as:  LANTUS Inject 18 Units into the skin at bedtime.   lactulose 10 GM/15ML solution Commonly known as:  CHRONULAC Take 45 mLs (30 g total) by mouth 2 (two) times daily.   levothyroxine 175 MCG tablet Commonly known as:  SYNTHROID, LEVOTHROID Take 175 mcg by mouth daily before breakfast.   Melatonin 5 MG Tabs Take 1 tablet by mouth at bedtime as needed.   metoprolol tartrate 25 MG tablet Commonly known as:  LOPRESSOR Take 25 mg by mouth 2 (two) times daily.   nystatin powder Generic drug:  nystatin Apply 1 g topically 2 (two) times daily.   polyethylene glycol packet Commonly known as:  MIRALAX / GLYCOLAX Take 17 g by mouth daily.   pravastatin 10 MG tablet Commonly known as:  PRAVACHOL Take 10 mg by mouth at bedtime.   senna 8.6 MG tablet Commonly known as:  SENOKOT Take 1 tablet by mouth 2 (two) times daily.   spironolactone 25 MG tablet Commonly known as:  ALDACTONE Take 25 mg by mouth daily.   sulfamethoxazole-trimethoprim 800-160 MG tablet Commonly known as:  BACTRIM DS,SEPTRA DS Take 1 tablet by mouth 2 (two) times daily.        DISCHARGE INSTRUCTIONS:    Follow as advised by [facility]>  If you experience worsening of your admission symptoms, develop shortness of breath, life threatening emergency, suicidal or homicidal thoughts you must seek medical attention immediately by calling 911 or calling your MD immediately  if symptoms less severe.  You Must read complete instructions/literature along with all the possible adverse reactions/side effects for all the Medicines you take and that have been prescribed to you. Take any new Medicines after you have completely understood  and accept all the possible adverse reactions/side effects.   Please note  You were cared for by a hospitalist during your hospital stay. If you have any questions about your discharge medications or the care you received while you were in the hospital after you are discharged, you can call the unit and asked to speak with the hospitalist on call if the hospitalist that took care of you is not available. Once you are discharged, your primary care physician will handle any further medical issues. Please note that NO REFILLS for any discharge medications will be authorized once you are discharged, as it is imperative that you return to your primary care physician (or establish a relationship with a primary care physician if you do not have one) for your aftercare needs so that they can reassess your need for medications and monitor your lab values.    Today   CHIEF COMPLAINT:   Chief Complaint  Patient presents with  . Fall  . Altered Mental Status  . Laceration    HISTORY  OF PRESENT ILLNESS:  Leonard Welch  is a 77 y.o. male with a known history of ESLD, chronic combined systolic + diastolic CHF (EF 16% w/ grade II diastolic dysfxn), Afib (Eliquis), T2IDDM, R CVA, MedTronic PPM, who p/w AMS, fall w/ facial laceration. Pt is altered, agitated, combative, restless, attempting to get out of bed, pulling at devices, interfering w/ medical staff and impeding clinical management. He cannot provide any Hx/ROS, and most of his verbiage is random and meaningless. His wife, at bedside, states that pt has had neurocognitive decline since 07/2017, and has been getting progressively more forgetful and stubborn. He has a great deal of medical comorbidity. He has been eating and drinking less. Hospitalizations have become more frequent, occurring as often as once every two months or so. His wife says she visited him in the morning at the [facility] facility where he presently stays. She says he was in good  health, well-appearing, and lucid. She says they played Materials engineer (card game). She states she stay until lunch, and then left. She received a call from the facility @~1700PM stating the pt had fallen, and was to be sent to the ED for evaluation. There was concern for hip fracture and head injury, but trauma series CT imaging performed in the ED was (-) acute fracture/dislocation. [name] tells me he was lucid at the time, and was discharged back to [facility]. [name] received another phone call from [facility] @~0400AM on Wednesday 06/21/2018, informing her that pt had been found face-down on the floor, unconscious and altered. EMS was called again. At the time of assessment, pt is AAOx1 (to self only) and confused. He continues to interfere with care and pull at lines/tubes/devices. Hx ESLD/hepatic encephalopathy, though Ammonia is only 36 on present admission. CT head (-) acute intracranial abnl.   VITAL SIGNS:  Blood pressure 105/65, pulse 83, temperature (!) 97.5 F (36.4 C), temperature source Oral, resp. rate 16, height 5\' 10"  (1.778 m), weight 102.1 kg, SpO2 100 %.  I/O:    Intake/Output Summary (Last 24 hours) at 06/22/2018 1631 Last data filed at 06/21/2018 2153 Gross per 24 hour  Intake -  Output 75 ml  Net -75 ml    PHYSICAL EXAMINATION:   GENERAL:  77 y.o.-year-old patient lying in the bed very ill-appearing with multiple skin lesions and lacerations of different age.  EYES: Pupils equal, round, reactive to light and accommodation. No scleral icterus. Extraocular muscles intact.  HEENT: Head have laceration on forehead, normocephalic. Oropharynx and nasopharynx clear.  NECK:  Supple, no jugular venous distention. No thyroid enlargement, no tenderness.  LUNGS: Normal breath sounds bilaterally, no wheezing, rales,rhonchi or crepitation. No use of accessory muscles of respiration.  CARDIOVASCULAR: S1, S2 normal. No murmurs, rubs, or gallops.  ABDOMEN: Soft, nontender,  nondistended. Bowel sounds present. No organomegaly or mass.  EXTREMITIES: No pedal edema, bilateral toe redness/ cyanosis, bot upper limbs- weak radial palpable pulses. NEUROLOGIC: Patient is oriented, moves both lower extremities up to 2-3 out of 5 power, his both upper extremities are weak with 0 out of 5 power and no response to painful stimuli.  He is awake and looking at me and speaking some words , following any commands. More calm today PSYCHIATRIC: The patient is alert and communicative some today. SKIN: Multiple injury marks, lacerations of different age and timeframe.   DATA REVIEW:   CBC Recent Labs  Lab 06/21/18 0525  WBC 10.1  HGB 15.3  HCT 44.2  PLT 201  Chemistries  Recent Labs  Lab 06/21/18 0525 06/21/18 0727 06/22/18 0559  NA 134*  --  141  K 4.6  --  3.3*  CL 97*  --  106  CO2 23  --  27  GLUCOSE 176*  --  154*  BUN 32*  --  31*  CREATININE 2.14*  --  1.90*  CALCIUM 10.0  --  9.9  MG  --  2.2  --   AST 45*  --   --   ALT 21  --   --   ALKPHOS 224*  --   --   BILITOT 2.2*  --   --     Cardiac Enzymes No results for input(s): TROPONINI in the last 168 hours.  Microbiology Results  Results for orders placed or performed during the hospital encounter of 06/21/18  MRSA PCR Screening     Status: Abnormal   Collection Time: 06/21/18 10:21 AM  Result Value Ref Range Status   MRSA by PCR POSITIVE (A) NEGATIVE Final    Comment:        The GeneXpert MRSA Assay (FDA approved for NASAL specimens only), is one component of a comprehensive MRSA colonization surveillance program. It is not intended to diagnose MRSA infection nor to guide or monitor treatment for MRSA infections. RESULT CALLED TO, READ BACK BY AND VERIFIED WITH: BERENICE AJEJO AT 1154 ON 06/21/2018, KP Performed at [facility], [address] [place] [place], [place] [address]     RADIOLOGY:  Dg Chest 1 View  Result Date: 06/21/2018 CLINICAL DATA:  Acute mental status changes  with confusion. Indwelling cardiac pacemaker. Current history of chronic kidney disease and diabetes. EXAM: Portable CHEST 1 VIEW COMPARISON:  None. FINDINGS: Cardiac silhouette markedly enlarged even allowing for AP portable technique. Thoracic aorta atherosclerotic. Hilar and mediastinal contours otherwise unremarkable. LEFT subclavian single lead transvenous pacemaker intact. Mild pulmonary venous hypertension without overt edema. Elevation of the LEFT hemidiaphragm with mild atelectasis at the LEFT lung base. Lungs otherwise clear. No visible pleural effusions. IMPRESSION: 1. Mild LEFT basilar atelectasis with elevation of the LEFT hemidiaphragm. No acute cardiopulmonary disease otherwise. 2. Marked cardiomegaly. Mild pulmonary venous hypertension without evidence of overt pulmonary edema currently. Electronically Signed   By: Hulan Saas M.D.   On: 06/21/2018 13:45   Ct Head Wo Contrast  Result Date: 06/21/2018 CLINICAL DATA:  Fall, forehead laceration. History of encephalopathy. EXAM: CT HEAD WITHOUT CONTRAST TECHNIQUE: Contiguous axial images were obtained from the base of the skull through the vertex without intravenous contrast. COMPARISON:  CT HEAD June 21, 2018 at 0456 hours FINDINGS: Motion degraded examination. BRAIN: No intraparenchymal hemorrhage, mass effect, midline shift or acute large vascular territory infarcts. Mesial RIGHT temporal occipital lobe encephalomalacia with mild ex vacuo dilatation RIGHT ventricle atrium. No hydrocephalus. Patchy supratentorial white matter hypodensities. No abnormal extra-axial fluid collections. VASCULAR: Moderate calcific atherosclerosis of the carotid siphons. SKULL: No skull fracture. 1 cm atypical lytic lesion RIGHT frontal calvarium (axial image 42). No significant scalp soft tissue swelling. Metallic foreign bodies LEFT periauricular soft tissues. SINUSES/ORBITS: Trace paranasal sinus mucosal thickening. Mastoid air cells are well aerated.The  included ocular globes and orbital contents are non-suspicious. OTHER: None. IMPRESSION: 1. Motion degraded examination.  No acute intracranial process. 2. 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 3. 1 cm lytic lesion RIGHT frontal calvarium could reflect atypical hemangioma though, metastatic disease not excluded. Given presence of LEFT periauricular metallic foreign bodies, MRI may be nondiagnostic though, not contraindicated. Electronically Signed  By: Awilda Metro M.D.   On: 06/21/2018 18:12   Ct Head Wo Contrast  Result Date: 06/21/2018 CLINICAL DATA:  77 y/o M; fall with laceration to the head, confusion, altered mental status. EXAM: CT HEAD WITHOUT CONTRAST CT CERVICAL SPINE WITHOUT CONTRAST TECHNIQUE: Multidetector CT imaging of the head and cervical spine was performed following the standard protocol without intravenous contrast. Multiplanar CT image reconstructions of the cervical spine were also generated. COMPARISON:  06/20/2018 CT head. FINDINGS: CT HEAD FINDINGS Brain: No evidence of acute infarction, hemorrhage, hydrocephalus, extra-axial collection or mass lesion/mass effect. Right occipital lobe chronic infarction. Very small chronic infarction in right frontal white matter. Moderate volume loss of the brain. Vascular: Calcific atherosclerosis of carotid siphons. Skull: Mild left frontal scalp contusion. No calvarial fracture identified. Sinuses/Orbits: No acute finding. Other: None. CT CERVICAL SPINE FINDINGS Alignment: Normal. Skull base and vertebrae: No acute fracture. No primary bone lesion or focal pathologic process. Soft tissues and spinal canal: Postsurgical changes within the left anterior neck. Disc levels: Multilevel disc and facet degenerative changes prominent endplate marginal osteophytes greatest at C6-7. Uncovertebral and facet hypertrophy results in bilateral C3-4, left C4-5, bilateral C5-6, bilateral C6-7 foraminal stenosis. Multilevel mild to moderate canal  stenosis. Upper chest: Calcified granulomas in the lung apices. Other: Negative. IMPRESSION: 1. Mild left frontal scalp contusion. No calvarial fracture. 2. No acute intracranial abnormality identified. 3. No acute fracture or dislocation of the cervical spine identified. 4. 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 5. Moderate cervical spondylosis greatest at C6-7 level. Electronically Signed   By: Mitzi Hansen M.D.   On: 06/21/2018 05:56   Ct Head Wo Contrast  Result Date: 06/20/2018 CLINICAL DATA:  Unwitnessed fall, trauma EXAM: CT HEAD WITHOUT CONTRAST TECHNIQUE: Contiguous axial images were obtained from the base of the skull through the vertex without intravenous contrast. COMPARISON:  None. FINDINGS: Brain: Brain atrophy noted with chronic white matter microvascular changes throughout both cerebral hemispheres. Medial right occipital lobe encephalomalacia posteriorly compatible with a previous right PCA territory infarct. No acute intracranial hemorrhage, new mass lesion, new infarction, midline shift, herniation, hydrocephalus, or extra-axial fluid collection. No focal mass effect or edema. Cisterns are patent. Cerebellar atrophy as well. Vascular: Intracranial atherosclerosis noted.  No hyperdense vessel. Skull: Normal. Negative for fracture or focal lesion. Sinuses/Orbits: No acute finding. Other: None. IMPRESSION: Atrophy and chronic white matter microvascular changes. Remote right occipital PCA territory infarct No acute intracranial abnormality by noncontrast CT. Electronically Signed   By: Judie Petit.  Shick M.D.   On: 06/20/2018 18:51   Ct Cervical Spine Wo Contrast  Result Date: 06/21/2018 CLINICAL DATA:  77 y/o M; fall with laceration to the head, confusion, altered mental status. EXAM: CT HEAD WITHOUT CONTRAST CT CERVICAL SPINE WITHOUT CONTRAST TECHNIQUE: Multidetector CT imaging of the head and cervical spine was performed following the standard  protocol without intravenous contrast. Multiplanar CT image reconstructions of the cervical spine were also generated. COMPARISON:  06/20/2018 CT head. FINDINGS: CT HEAD FINDINGS Brain: No evidence of acute infarction, hemorrhage, hydrocephalus, extra-axial collection or mass lesion/mass effect. Right occipital lobe chronic infarction. Very small chronic infarction in right frontal white matter. Moderate volume loss of the brain. Vascular: Calcific atherosclerosis of carotid siphons. Skull: Mild left frontal scalp contusion. No calvarial fracture identified. Sinuses/Orbits: No acute finding. Other: None. CT CERVICAL SPINE FINDINGS Alignment: Normal. Skull base and vertebrae: No acute fracture. No primary bone lesion or focal pathologic process. Soft tissues and spinal  canal: Postsurgical changes within the left anterior neck. Disc levels: Multilevel disc and facet degenerative changes prominent endplate marginal osteophytes greatest at C6-7. Uncovertebral and facet hypertrophy results in bilateral C3-4, left C4-5, bilateral C5-6, bilateral C6-7 foraminal stenosis. Multilevel mild to moderate canal stenosis. Upper chest: Calcified granulomas in the lung apices. Other: Negative. IMPRESSION: 1. Mild left frontal scalp contusion. No calvarial fracture. 2. No acute intracranial abnormality identified. 3. No acute fracture or dislocation of the cervical spine identified. 4. 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 5. Moderate cervical spondylosis greatest at C6-7 level. Electronically Signed   By: Mitzi Hansen M.D.   On: 06/21/2018 05:56   Ct Hip Left Wo Contrast  Result Date: 06/20/2018 CLINICAL DATA:  Pt to ED via EMS from [facility] c/o unwitnessed fall today, patient states was transferring from wheelchair to bed when the wheelchair went backwards causing patient to fall into a door hitting his back and left hip. Denies hitting head or LOC EXAM: CT OF THE LEFT  HIP WITHOUT CONTRAST TECHNIQUE: Multidetector CT imaging of the left hip was performed according to the standard protocol. Multiplanar CT image reconstructions were also generated. COMPARISON:  Current left hip radiographs. FINDINGS: Bones/Joint/Cartilage No fracture. There is a small collar of osteophytes along the base of the left femoral head accounting for the impression of a possible fracture on the standard radiographs. No bone lesion. Left hip joint is normally aligned. There is concentric hip joint space narrowing with greater narrowing along superolateral joint space. No convincing joint effusion. Ligaments Suboptimally assessed by CT. Muscles and Tendons The muscles and tendons inserting along the proximal appear intact. No muscle contusion or hematoma on the included field. Soft tissues No soft tissue mass or significant contusion or hematoma. No acute findings noted in the visualized portions of the left pelvis. IMPRESSION: 1. No fracture. Specifically, no evidence of a left femoral neck fracture. No acute findings. 2. Degenerative/arthropathic changes of the left hip joint with a small collar osteophytes along the base of the femoral head. Electronically Signed   By: Amie Portland M.D.   On: 06/20/2018 19:54   Dg Hip Unilat W Or Wo Pelvis 2-3 Views Left  Result Date: 06/20/2018 CLINICAL DATA:  Unwitnessed fall today while transferring from the wheelchair to bed with left hip pain. EXAM: DG HIP (WITH OR WITHOUT PELVIS) 2-3V LEFT COMPARISON:  None. FINDINGS: Mild diffuse osteopenia. Mild degenerative change of the left hip. There are subtle findings suggesting a subcapital left femoral neck fracture there are degenerative changes of the spine. Visualized portions of the right hip arthroplasty are intact. IMPRESSION: Subtle findings suggesting left subcapital femoral neck fracture. CT or MRI may be helpful for confirmation. Electronically Signed   By: Elberta Fortis M.D.   On: 06/20/2018 19:02     EKG:   Orders placed or performed during the hospital encounter of 06/21/18  . EKG 12-Lead  . EKG 12-Lead  . EKG      Management plans discussed with the patient, family and they are in agreement.  CODE STATUS:     Code Status Orders  (From admission, onward)         Start     Ordered   06/21/18 1127  Do not attempt resuscitation (DNR)  Continuous    Question Answer Comment  In the event of cardiac or respiratory ARREST Do not call a "code blue"   In the event of cardiac or respiratory ARREST Do  not perform Intubation, CPR, defibrillation or ACLS   In the event of cardiac or respiratory ARREST Use medication by any route, position, wound care, and other measures to relive pain and suffering. May use oxygen, suction and manual treatment of airway obstruction as needed for comfort.      06/21/18 1126        Code Status History    Date Active Date Inactive Code Status Order ID Comments User Context   06/21/2018 0832 06/21/2018 1126 Full Code [id number]  Barbaraann Rondo, MD Inpatient    Advance Directive Documentation     Most Recent Value  Type of Advance Directive  Healthcare Power of Attorney, Living will, Out of facility DNR (pink MOST or yellow form)  Pre-existing out of facility DNR order (yellow form or pink MOST form)  -  "MOST" Form in Place?  -      TOTAL TIME TAKING CARE OF THIS PATIENT: 85 minutes.    Altamese Dilling M.D on 06/22/2018 at 4:31 PM  Between 7am to 6pm - Pager - [phone]  After 6pm go to [email or web address] - password [email or web address]  [facility]  Office  [phone]  CC: Primary care physician; Barnett Hatter, MD   Note: This dictation was prepared with Dragon dictation along with smaller phrase technology. Any transcriptional errors that result from this process are unintentional.

## 2018-06-22 NOTE — Clinical Social Work Note (Signed)
Clinical Social Work Assessment  Patient Details  Name: Leonard Welch MRN: [id number] Date of Birth: 01-01-1941  Date of referral:  06/22/18               Reason for consult:  Facility Placement                Permission sought to share information with:  Family Supports, Customer service manager Permission granted to share information::  Yes, Verbal Permission Granted  Name::     Leonard, Welch [phone]  [phone] or Leonard, Welch   [phone]   Agency::  SNF admissions  Relationship::     Contact Information:     Housing/Transportation Living arrangements for the past 2 months:  [place], [place], [place] of Information:  Patient, Spouse Patient Interpreter Needed:  None Criminal Activity/Legal Involvement Pertinent to Current Situation/Hospitalization:  No - Comment as needed Significant Relationships:  Adult Children, Spouse Lives with:  Spouse Do you feel safe going back to the place where you live?  No Need for family participation in patient care:  Yes (Comment)  Care giving concerns:  Patient's wife feels patient needs to go to SNF again, and will probably have to transition to long term care.   Social Worker assessment / plan:  Patient is a 77 year old male who is married and was living with his wife, and just now moved to [facility]F. Patient has some dementia, assessment completed by speaking with patient's wife mainly.  CSW met with patient and his wife regarding SNF placement.  Patient was just recently discharged from [facility] and then went to [facility]F where he was for a day, and then had a fall and was admitted to the hospital again.  CSW explained that his insurances days will not start over they will just continue from when he discharged from SNF.  Patient's wife would like patient to return back to [place] if possible, if not she would like [facility]  second choice, and then [facility] or [facility] as a third choice since she lives in [place].  CSW informed her that the referrals can be made, CSW was given permission to begin bed search at the requested SNFs.  CSW will faxed required clinicals, CSW to continue to follow patient's progress throughout discharge planning.  CSW was informed that patient will be transferring to [facility].     Employmnt status:  Retired Forensic scientist:  Medicare PT Recommendations:  [place] / Referral to community resources:  [place]  Patient/Family's Response to care:  Patient and family are in agreement to returning to SNF.  Patient/Family's Understanding of and Emotional Response to Diagnosis, Current Treatment, and Prognosis: Patient's wife is hopeful physician can figure out what is causing his falls and weakness.  They are hopeful the MRI will find what is affecting patient.  Emotional Assessment Appearance:  Appears stated age Attitude/Demeanor/Rapport:    Affect (typically observed):  Appropriate, Calm Orientation:  Oriented to Self, Oriented to Place Alcohol / Substance use:  Not Applicable Psych involvement (Current and /or in the community):  No (Comment)  Discharge Needs  Concerns to be addressed:  Care Coordination, Lack of Support, Cognitive Concerns Readmission within the last 30 days:  No Current discharge risk:  Lack of support system, Cognitively Impaired Barriers to Discharge:  Continued Medical Work up   [name] 06/22/2018, 6:56 PM

## 2018-06-22 NOTE — Progress Notes (Addendum)
Spoke to Dr. Darreld Mclean at [facility] health- hospitalist.  Dr. Emmaline Life had spoke to Dr. Amada Jupiter ( neurologist) and they have confirmed to do consult on the pt once admitted to hospitalist care.  Pt is to be transferred to [facility] once bed is available.  Pt must be transferred to first available facility. Discharge summary is done. May need to change details in Discharge tab under provider navigator in EMTALA section as follows depending on Which facility accepts the pt first.  [facility]- Accepting Physician- Dr. Darreld Mclean, Dr. Estil Daft- Accepting physician- Dr.Sasaki, Pernell Dupre  We may need to change the information accordingly , which facility have the bed tonight.  Additional time spent 45 min.

## 2018-06-22 NOTE — Clinical Social Work Note (Signed)
CSW met with patient and his wife regarding SNF placement.  Patient was just recently discharged from [facility] and then went to ALF where he was for a day, and then had a fall and was admitted to the hospital again.  CSW explained that his insurances days will not start over they will just continue from when he discharged.  Patient's wife would like patient to return back to [place] if possible, if not she would like [facility] second choice, and then [facility] or [place] as a third choice since she lives in [place].  CSW informed her that the referrals can be made, CSW was given permission to begin bed search at the requested SNFs.  CSW will faxed required clinicals, CSW to continue to follow patient's progress throughout discharge planning.  6:28pm  CSW was informed that patient has been approved for transfer to [facility] once bed is available.  Jones Broom. [place], MSW, [place]  06/22/2018 6:26 PM

## 2018-06-22 NOTE — Evaluation (Signed)
Physical Therapy Evaluation Patient Details Name: Leonard Welch MRN: [id number] DOB: 29-Jul-1941 Today's Date: 06/22/2018   History of Present Illness  presented to ER after fall, noted with AMS (and laceration to forehead); admitted for management of hepatic encephalopathy.  Of note, initial CT head and c-spine, L hip x-ray negative for acute injury.  Clinical Impression  Upon evaluation, patient resting with eyes closed, but opens eyes and interacts with therapist with verbal cuing/light touch.  Oriented to self, general location; follows simple commands with consistent redirection to task.  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  Bilat UEs generally flaccid (trace movement R > L shoulder and elbow) with sensory deficit evident elbows distally.  Unable to utilize either UE functionally at this time.  Bilat LEs generally weakened, but demonstrates active strength at least 3-/5. Movement generally discoordinated, but active at all planes, all joints throughout.  Anticipate need for max/total assist for bed mobility and functional activities; however, deferred pending neurology consult and potent for additional c-spine work up.  Presentation suggestive of potential central cord injury; may benefit from MRI to further assess.  Discussed with attending physician, to order neuro consult for further work up. Would benefit from skilled PT to address above deficits and promote optimal return to PLOF; recommend transition to STR upon discharge from acute hospitalization.     Follow Up Recommendations SNF    Equipment Recommendations       Recommendations for Other Services       Precautions / Restrictions Precautions Precaution Comments: contact iso Restrictions Weight Bearing Restrictions: No      Mobility  Bed Mobility               General bed mobility comments: deferred pending neurology consult and potent  for additional c-spine work up  Transfers                 General transfer comment: deferred pending neurology consult and potent for additional c-spine work up  Ambulation/Gait             General Gait Details: deferred pending neurology consult and potent for additional c-spine work up  [employer] Mobility    Modified Rankin (Stroke Patients Only)       Balance                                             Pertinent Vitals/Pain Pain Assessment: Faces Faces Pain Scale: Hurts little more Pain Location: L shoulder Pain Descriptors / Indicators: Grimacing Pain Intervention(s): Limited activity within patient's tolerance;Monitored during session;Repositioned    Home Living Family/patient expects to be discharged to:: Assisted living(Just transitioned to [facility]; resident for approx 2 days prior to this admission)               Home Equipment: Walker - 4 wheels      Prior Function Level of Independence: Needs assistance         Comments: Recently completed course of rehab after hospitalization at outside facility; ambulatory with 4[facility], assist from staff for ADLs.  Does ambulate to/from dining hall for meals, but escorted/assisted by staff as needed.     Hand Dominance   Dominant Hand: Right    Extremity/Trunk Assessment   Upper Extremity  Assessment Upper Extremity Assessment: (R shoulder flex/abduct and R elbow flex 1/5, R elbow ext 2-/5; otherwise 0/5.  L shoulder flex/adduct 1/5, otherwise 0/5.  Appears with sensory deficit elbows distally.  Questionable tone in elbow ext > flex L UE)    Lower Extremity Assessment Lower Extremity Assessment: Generalized weakness(grossly 3-/5 throughout bilat LEs; increased time/attention to task required. Baseline neuropathy bilat LEs, mid-calf distally)       Communication   Communication: No difficulties  Cognition Arousal/Alertness: (rests with eyes  closed, but opens to voice and light touch) Behavior During Therapy: WFL for tasks assessed/performed Overall Cognitive Status: History of cognitive impairments - at baseline                                 General Comments: oriented to self, general location; follows one step commands with consistent redirection to task at hand      General Comments      Exercises Other Exercises Other Exercises: Supine LE therex, 1x5, AROM for muscular strength/endurance; limited fine motor coordination/control evident.   Assessment/Plan    PT Assessment Patient needs continued PT services  PT Problem List Decreased strength;Decreased range of motion;Decreased activity tolerance;Decreased balance;Decreased mobility;Decreased coordination;Decreased cognition;Decreased knowledge of use of DME;Decreased safety awareness;Decreased knowledge of precautions;Decreased skin integrity;Impaired sensation       PT Treatment Interventions Functional mobility training;Therapeutic activities;Gait training;DME instruction;Therapeutic exercise;Balance training;Neuromuscular re-education;Cognitive remediation;Patient/family education    PT Goals (Current goals can be found in the Care Plan section)  Acute Rehab PT Goals Patient Stated Goal: per wife, to figure out what's going on PT Goal Formulation: With patient/family Time For Goal Achievement: 07/06/18 Potential to Achieve Goals: Fair Additional Goals Additional Goal #1: Assess and establish goals for OOB/mobility as medically appropriate.    Frequency Min 2X/week   Barriers to discharge Decreased caregiver support      Co-evaluation               AM-PAC PT "6 Clicks" Daily Activity  Outcome Measure Difficulty turning over in bed (including adjusting bedclothes, sheets and blankets)?: Unable Difficulty moving from lying on back to sitting on the side of the bed? : Unable Difficulty sitting down on and standing up from a chair with  arms (e.g., wheelchair, bedside commode, etc,.)?: Unable Help needed moving to and from a bed to chair (including a wheelchair)?: Total Help needed walking in hospital room?: Total Help needed climbing 3-5 steps with a railing? : Total 6 Click Score: 6    End of Session   Activity Tolerance: Patient tolerated treatment well Patient left: in bed;with bed alarm set;with family/visitor present;with call bell/phone within reach Nurse Communication: Mobility status PT Visit Diagnosis: Muscle weakness (generalized) (M62.81);Difficulty in walking, not elsewhere classified (R26.2);Repeated falls (R29.6);Other symptoms and signs involving the nervous system (R29.898)    Time: 7253-6644 PT Time Calculation (min) (ACUTE ONLY): 30 min   Charges:   PT Evaluation $PT Eval High Complexity: 1 High PT Treatments $Therapeutic Exercise: 8-22 mins       Janny Crute H. Manson Passey, PT, DPT, NCS 06/22/18, 2:02 PM [phone]

## 2018-06-22 NOTE — NC FL2 (Signed)
Leonard Welch MEDICAID FL2 LEVEL OF CARE SCREENING TOOL     IDENTIFICATION  Patient Name: Leonard Welch Birthdate: 19-Apr-1941 Sex: male Admission Date (Current Location): 06/21/2018  [place] and [place] Number:  Chiropodist and Address:  [facility], [address] [place], [place] [address]      Provider Number: [id number]  Attending Physician Name and Address:  Leonard Welch, *  Relative Name and Phone Number:  Leonard Welch, Leonard Welch [phone]  [phone] or Leonard Welch, Leonard Welch   [phone]     Current Level of Care: Hospital Recommended Level of Care: Skilled Nursing Facility Prior Approval Number:    Date Approved/Denied:   PASRR Number: [id number]  Discharge Plan: SNF    Current Diagnoses: Patient Active Problem List   Diagnosis Date Noted  . Confusion   . Weakness generalized   . Hepatic encephalopathy (HCC) 06/21/2018  . Palliative care by specialist   . DNR (do not resuscitate)   . Adult failure to thrive     Orientation RESPIRATION BLADDER Height & Weight     Self, Place  Normal Incontinent Weight: 225 lb (102.1 kg) Height:  5\' 10"  (177.8 cm)  BEHAVIORAL SYMPTOMS/MOOD NEUROLOGICAL BOWEL NUTRITION STATUS      Continent Diet(Renal Carb modified)  AMBULATORY STATUS COMMUNICATION OF NEEDS Skin   Limited Assist Verbally Skin abrasions, Surgical wounds                       Personal Care Assistance Level of Assistance  Bathing, Feeding, Dressing Bathing Assistance: Limited assistance Feeding assistance: Independent Dressing Assistance: Limited assistance     Functional Limitations Info  Sight, Hearing, Speech Sight Info: Adequate Hearing Info: Adequate Speech Info: Adequate    SPECIAL CARE FACTORS FREQUENCY  PT (By licensed PT), OT (By licensed OT)     PT Frequency: 5x a week OT Frequency: 5x a week            Contractures Contractures Info: Not present    Additional Factors Info  Code  Status, Allergies, Insulin Sliding Scale Code Status Info: DNR Allergies Info: AMIODARONE, WARFARIN AND RELATED   Insulin Sliding Scale Info: insulin aspart (novoLOG) injection 0-15 Units 3x a day with meals       Current Medications (06/22/2018):  This is the current hospital active medication list Current Facility-Administered Medications  Medication Dose Route Frequency Provider Last Rate Last Dose  . bacitracin ointment   Topical BID Leonard Dilling, MD      . bisacodyl (DULCOLAX) EC tablet 5 mg  5 mg Oral Daily PRN Barbaraann Rondo, MD      . bumetanide (BUMEX) tablet 2 mg  2 mg Oral Daily Barbaraann Rondo, MD   2 mg at 06/22/18 0940  . [START ON 06/23/2018] Chlorhexidine Gluconate Cloth 2 % PADS 6 each  6 each Topical Q0600 Leonard Dilling, MD      . insulin aspart (novoLOG) injection 0-15 Units  0-15 Units Subcutaneous TID WC Barbaraann Rondo, MD   3 Units at 06/22/18 1818  . insulin aspart (novoLOG) injection 0-5 Units  0-5 Units Subcutaneous QHS Sridharan, Prasanna, MD      . insulin glargine (LANTUS) injection 18 Units  18 Units Subcutaneous QHS Barbaraann Rondo, MD   18 Units at 06/21/18 2151  . lactulose (CHRONULAC) 10 GM/15ML solution 30 g  30 g Oral TID Barbaraann Rondo, MD   30 g at 06/22/18 1645  . lactulose (CHRONULAC) 10 GM/15ML solution 30 g  30  g Oral Once Barbaraann Rondo, MD      . levothyroxine (SYNTHROID, LEVOTHROID) tablet 175 mcg  175 mcg Oral QAC breakfast Barbaraann Rondo, MD   175 mcg at 06/22/18 0937  . metoprolol tartrate (LOPRESSOR) tablet 25 mg  25 mg Oral BID Barbaraann Rondo, MD   25 mg at 06/22/18 0940  . morphine 2 MG/ML injection 2 mg  2 mg Intravenous Q4H PRN Leonard Dilling, MD   2 mg at 06/22/18 1420  . mupirocin ointment (BACTROBAN) 2 % 1 application  1 application Nasal BID Leonard Dilling, MD      . ondansetron (ZOFRAN) tablet 4 mg  4 mg Oral Q6H PRN Barbaraann Rondo, MD       Or  . ondansetron  (ZOFRAN) injection 4 mg  4 mg Intravenous Q6H PRN Marjie Skiff, Prasanna, MD      . polyethylene glycol (MIRALAX / GLYCOLAX) packet 17 g  17 g Oral Daily Barbaraann Rondo, MD   17 g at 06/22/18 0943  . potassium chloride SA (K-DUR,KLOR-CON) CR tablet 20 mEq  20 mEq Oral BID Leonard Dilling, MD   20 mEq at 06/22/18 0939  . pravastatin (PRAVACHOL) tablet 10 mg  10 mg Oral QHS Barbaraann Rondo, MD      . senna-docusate (Senokot-S) tablet 1 tablet  1 tablet Oral QHS PRN Barbaraann Rondo, MD      . spironolactone (ALDACTONE) tablet 25 mg  25 mg Oral Daily Barbaraann Rondo, MD   25 mg at 06/22/18 0940  . sulfamethoxazole-trimethoprim (BACTRIM DS,SEPTRA DS) 800-160 MG per tablet 1 tablet  1 tablet Oral BID Barbaraann Rondo, MD   1 tablet at 06/22/18 0940     Discharge Medications: Please see discharge summary for a list of discharge medications.  Relevant Imaging Results:  Relevant Lab Results:   Additional Information SSN [id number]  [name], [place]

## 2018-06-22 NOTE — Progress Notes (Signed)
Patient ID: Leonard Welch, male   DOB: 1941-07-05, 77 y.o.   MRN: [id number]  This NP visited patient at the bedside as a follow up to  yesterday's GOCs meeting.  Patient is more alert today, less agitated and following commands.  Wife at bedside.  Continued conversation regarding current medical situation, long term GOCs, disposition and anticipatory care needs.  Wife understands the seriousness of the current medical situation and likely long term poor prognosis and high risk for decompensation;  however at this time she is hopeful for continued improvement.  She anticipates neurology consult to evaluate BUE weakness.  Ultimately she hopes her husband may quality for short term rehab and be eligible at a facility closer to [employer].  Recommend community based palliative at that time.   Completed MOST form in chart  Discussed with wife  the importance of continued conversation and the  medical providers regarding overall plan of care and treatment options,  ensuring decisions are within the context of the patients values and GOCs.  Questions and concerns addressed   Discussed with Dr Elisabeth Pigeon  Total time spent on the unit was 40 minutes  Greater than 50% of the time was spent in counseling and coordination of care  Lorinda Creed NP  Palliative Medicine Team Team Phone # [phone] Pager [phone]

## 2018-06-22 NOTE — Plan of Care (Signed)
Patient has pain with repositioning. Patient complains of left shoulder pain. Medicate as needed. Patient is very cooperative but hard of hearing. Speak loudly to explain any interventions.

## 2018-06-22 NOTE — Progress Notes (Signed)
[employer] [facility] [place] [facility]   PATIENT NAME: Leonard Welch    MR#:  [id number]  DATE OF BIRTH:  [birth year]  SUBJECTIVE:  CHIEF COMPLAINT:   Chief Complaint  Patient presents with  . Fall  . Altered Mental Status  . Laceration   For last few months patient had multiple admissions at [facility] due to altered mental status and weakness issues with at one point he was suspected to have high ammonia and hepatic encephalopathy but although infection work-up and liver work-ups were negative and patient's symptoms persisted an MRI had reported having some old strokes.  At that and they were not able to find any clear diagnosis as per patient's wife who is present in the room.  They did sent him to the assisted living place nearby where he was more confused and had a fall and sent to our emergency room last night. Patient was completely confused and as per wife he is not able to move both upper limbs now. Much improved mentally today, having simple conversation and as per wife back to baseline.  Still have persistent upper extremity weakness on both side. REVIEW OF SYSTEMS:     Review of Systems  Constitutional: Negative for fever and malaise/fatigue.  HENT: Negative for congestion, ear discharge, ear pain and hearing loss.   Eyes: Negative for double vision, photophobia and discharge.  Respiratory: Negative for hemoptysis, sputum production and shortness of breath.   Cardiovascular: Negative for chest pain, claudication and leg swelling.  Gastrointestinal: Negative for abdominal pain, diarrhea, nausea and vomiting.  Genitourinary: Negative for dysuria, frequency, hematuria and urgency.  Musculoskeletal: Negative for joint pain, myalgias and neck pain.  Skin: Negative for rash.  Neurological: Positive for focal weakness. Negative for dizziness, tremors and speech change.  Psychiatric/Behavioral: Negative for depression and suicidal ideas.    DRUG ALLERGIES:    Allergies  Allergen Reactions  . Amiodarone   . Warfarin And Related     VITALS:  Blood pressure 105/65, pulse 83, temperature (!) 97.5 F (36.4 C), temperature source Oral, resp. rate 16, height 5\' 10"  (1.778 m), weight 102.1 kg, SpO2 100 %.  PHYSICAL EXAMINATION:   GENERAL:  77 y.o.-year-old patient lying in the bed very ill-appearing with multiple skin lesions and lacerations of different age.  EYES: Pupils equal, round, reactive to light and accommodation. No scleral icterus. Extraocular muscles intact.  HEENT: Head have laceration on forehead, normocephalic. Oropharynx and nasopharynx clear.  NECK:  Supple, no jugular venous distention. No thyroid enlargement, no tenderness.  LUNGS: Normal breath sounds bilaterally, no wheezing, rales,rhonchi or crepitation. No use of accessory muscles of respiration.  CARDIOVASCULAR: S1, S2 normal. No murmurs, rubs, or gallops.  ABDOMEN: Soft, nontender, nondistended. Bowel sounds present. No organomegaly or mass.  EXTREMITIES: No pedal edema, bilateral toe redness/ cyanosis, bot upper limbs- weak radial palpable pulses. NEUROLOGIC: Patient is oriented, moves both lower extremities up to 2-3 out of 5 power, his both upper extremities are weak with 0 out of 5 power and no response to painful stimuli.  He is awake and looking at me and speaking some words , following any commands. More calm today PSYCHIATRIC: The patient is alert and communicative some today. SKIN: Multiple injury marks, lacerations of different age and timeframe.   Physical Exam LABORATORY PANEL:   CBC Recent Labs  Lab 06/21/18 0525  WBC 10.1  HGB 15.3  HCT 44.2  PLT 201   ------------------------------------------------------------------------------------------------------------------  Chemistries  Recent Labs  Lab  06/21/18 0525 06/21/18 0727 06/22/18 0559  NA 134*  --  141  K 4.6  --  3.3*  CL 97*  --  106  CO2 23  --  27  GLUCOSE 176*  --  154*  BUN 32*   --  31*  CREATININE 2.14*  --  1.90*  CALCIUM 10.0  --  9.9  MG  --  2.2  --   AST 45*  --   --   ALT 21  --   --   ALKPHOS 224*  --   --   BILITOT 2.2*  --   --    ------------------------------------------------------------------------------------------------------------------  Cardiac Enzymes No results for input(s): TROPONINI in the last 168 hours. ------------------------------------------------------------------------------------------------------------------  RADIOLOGY:  Dg Chest 1 View  Result Date: 06/21/2018 CLINICAL DATA:  Acute mental status changes with confusion. Indwelling cardiac pacemaker. Current history of chronic kidney disease and diabetes. EXAM: Portable CHEST 1 VIEW COMPARISON:  None. FINDINGS: Cardiac silhouette markedly enlarged even allowing for AP portable technique. Thoracic aorta atherosclerotic. Hilar and mediastinal contours otherwise unremarkable. LEFT subclavian single lead transvenous pacemaker intact. Mild pulmonary venous hypertension without overt edema. Elevation of the LEFT hemidiaphragm with mild atelectasis at the LEFT lung base. Lungs otherwise clear. No visible pleural effusions. IMPRESSION: 1. Mild LEFT basilar atelectasis with elevation of the LEFT hemidiaphragm. No acute cardiopulmonary disease otherwise. 2. Marked cardiomegaly. Mild pulmonary venous hypertension without evidence of overt pulmonary edema currently. Electronically Signed   By: Hulan Saas M.D.   On: 06/21/2018 13:45   Ct Head Wo Contrast  Result Date: 06/21/2018 CLINICAL DATA:  Fall, forehead laceration. History of encephalopathy. EXAM: CT HEAD WITHOUT CONTRAST TECHNIQUE: Contiguous axial images were obtained from the base of the skull through the vertex without intravenous contrast. COMPARISON:  CT HEAD June 21, 2018 at 0456 hours FINDINGS: Motion degraded examination. BRAIN: No intraparenchymal hemorrhage, mass effect, midline shift or acute large vascular territory  infarcts. Mesial RIGHT temporal occipital lobe encephalomalacia with mild ex vacuo dilatation RIGHT ventricle atrium. No hydrocephalus. Patchy supratentorial white matter hypodensities. No abnormal extra-axial fluid collections. VASCULAR: Moderate calcific atherosclerosis of the carotid siphons. SKULL: No skull fracture. 1 cm atypical lytic lesion RIGHT frontal calvarium (axial image 42). No significant scalp soft tissue swelling. Metallic foreign bodies LEFT periauricular soft tissues. SINUSES/ORBITS: Trace paranasal sinus mucosal thickening. Mastoid air cells are well aerated.The included ocular globes and orbital contents are non-suspicious. OTHER: None. IMPRESSION: 1. Motion degraded examination.  No acute intracranial process. 2. 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 3. 1 cm lytic lesion RIGHT frontal calvarium could reflect atypical hemangioma though, metastatic disease not excluded. Given presence of LEFT periauricular metallic foreign bodies, MRI may be nondiagnostic though, not contraindicated. Electronically Signed   By: Awilda Metro M.D.   On: 06/21/2018 18:12   Ct Head Wo Contrast  Result Date: 06/21/2018 CLINICAL DATA:  77 y/o M; fall with laceration to the head, confusion, altered mental status. EXAM: CT HEAD WITHOUT CONTRAST CT CERVICAL SPINE WITHOUT CONTRAST TECHNIQUE: Multidetector CT imaging of the head and cervical spine was performed following the standard protocol without intravenous contrast. Multiplanar CT image reconstructions of the cervical spine were also generated. COMPARISON:  06/20/2018 CT head. FINDINGS: CT HEAD FINDINGS Brain: No evidence of acute infarction, hemorrhage, hydrocephalus, extra-axial collection or mass lesion/mass effect. Right occipital lobe chronic infarction. Very small chronic infarction in right frontal white matter. Moderate volume loss of the brain. Vascular: Calcific atherosclerosis of carotid siphons. Skull: Mild left frontal  scalp  contusion. No calvarial fracture identified. Sinuses/Orbits: No acute finding. Other: None. CT CERVICAL SPINE FINDINGS Alignment: Normal. Skull base and vertebrae: No acute fracture. No primary bone lesion or focal pathologic process. Soft tissues and spinal canal: Postsurgical changes within the left anterior neck. Disc levels: Multilevel disc and facet degenerative changes prominent endplate marginal osteophytes greatest at C6-7. Uncovertebral and facet hypertrophy results in bilateral C3-4, left C4-5, bilateral C5-6, bilateral C6-7 foraminal stenosis. Multilevel mild to moderate canal stenosis. Upper chest: Calcified granulomas in the lung apices. Other: Negative. IMPRESSION: 1. Mild left frontal scalp contusion. No calvarial fracture. 2. No acute intracranial abnormality identified. 3. No acute fracture or dislocation of the cervical spine identified. 4. 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 5. Moderate cervical spondylosis greatest at C6-7 level. Electronically Signed   By: Mitzi Hansen M.D.   On: 06/21/2018 05:56   Ct Head Wo Contrast  Result Date: 06/20/2018 CLINICAL DATA:  Unwitnessed fall, trauma EXAM: CT HEAD WITHOUT CONTRAST TECHNIQUE: Contiguous axial images were obtained from the base of the skull through the vertex without intravenous contrast. COMPARISON:  None. FINDINGS: Brain: Brain atrophy noted with chronic white matter microvascular changes throughout both cerebral hemispheres. Medial right occipital lobe encephalomalacia posteriorly compatible with a previous right PCA territory infarct. No acute intracranial hemorrhage, new mass lesion, new infarction, midline shift, herniation, hydrocephalus, or extra-axial fluid collection. No focal mass effect or edema. Cisterns are patent. Cerebellar atrophy as well. Vascular: Intracranial atherosclerosis noted.  No hyperdense vessel. Skull: Normal. Negative for fracture or focal lesion.  Sinuses/Orbits: No acute finding. Other: None. IMPRESSION: Atrophy and chronic white matter microvascular changes. Remote right occipital PCA territory infarct No acute intracranial abnormality by noncontrast CT. Electronically Signed   By: Judie Petit.  Shick M.D.   On: 06/20/2018 18:51   Ct Cervical Spine Wo Contrast  Result Date: 06/21/2018 CLINICAL DATA:  77 y/o M; fall with laceration to the head, confusion, altered mental status. EXAM: CT HEAD WITHOUT CONTRAST CT CERVICAL SPINE WITHOUT CONTRAST TECHNIQUE: Multidetector CT imaging of the head and cervical spine was performed following the standard protocol without intravenous contrast. Multiplanar CT image reconstructions of the cervical spine were also generated. COMPARISON:  06/20/2018 CT head. FINDINGS: CT HEAD FINDINGS Brain: No evidence of acute infarction, hemorrhage, hydrocephalus, extra-axial collection or mass lesion/mass effect. Right occipital lobe chronic infarction. Very small chronic infarction in right frontal white matter. Moderate volume loss of the brain. Vascular: Calcific atherosclerosis of carotid siphons. Skull: Mild left frontal scalp contusion. No calvarial fracture identified. Sinuses/Orbits: No acute finding. Other: None. CT CERVICAL SPINE FINDINGS Alignment: Normal. Skull base and vertebrae: No acute fracture. No primary bone lesion or focal pathologic process. Soft tissues and spinal canal: Postsurgical changes within the left anterior neck. Disc levels: Multilevel disc and facet degenerative changes prominent endplate marginal osteophytes greatest at C6-7. Uncovertebral and facet hypertrophy results in bilateral C3-4, left C4-5, bilateral C5-6, bilateral C6-7 foraminal stenosis. Multilevel mild to moderate canal stenosis. Upper chest: Calcified granulomas in the lung apices. Other: Negative. IMPRESSION: 1. Mild left frontal scalp contusion. No calvarial fracture. 2. No acute intracranial abnormality identified. 3. No acute fracture or  dislocation of the cervical spine identified. 4. 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 5. Moderate cervical spondylosis greatest at C6-7 level. Electronically Signed   By: Mitzi Hansen M.D.   On: 06/21/2018 05:56   Ct Hip Left Wo Contrast  Result Date: 06/20/2018  CLINICAL DATA:  Pt to ED via EMS from [facility] c/o unwitnessed fall today, patient states was transferring from wheelchair to bed when the wheelchair went backwards causing patient to fall into a door hitting his back and left hip. Denies hitting head or LOC EXAM: CT OF THE LEFT HIP WITHOUT CONTRAST TECHNIQUE: Multidetector CT imaging of the left hip was performed according to the standard protocol. Multiplanar CT image reconstructions were also generated. COMPARISON:  Current left hip radiographs. FINDINGS: Bones/Joint/Cartilage No fracture. There is a small collar of osteophytes along the base of the left femoral head accounting for the impression of a possible fracture on the standard radiographs. No bone lesion. Left hip joint is normally aligned. There is concentric hip joint space narrowing with greater narrowing along superolateral joint space. No convincing joint effusion. Ligaments Suboptimally assessed by CT. Muscles and Tendons The muscles and tendons inserting along the proximal appear intact. No muscle contusion or hematoma on the included field. Soft tissues No soft tissue mass or significant contusion or hematoma. No acute findings noted in the visualized portions of the left pelvis. IMPRESSION: 1. No fracture. Specifically, no evidence of a left femoral neck fracture. No acute findings. 2. Degenerative/arthropathic changes of the left hip joint with a small collar osteophytes along the base of the femoral head. Electronically Signed   By: Amie Portland M.D.   On: 06/20/2018 19:54   Dg Hip Unilat W Or Wo Pelvis 2-3 Views Left  Result Date: 06/20/2018 CLINICAL DATA:  Unwitnessed  fall today while transferring from the wheelchair to bed with left hip pain. EXAM: DG HIP (WITH OR WITHOUT PELVIS) 2-3V LEFT COMPARISON:  None. FINDINGS: Mild diffuse osteopenia. Mild degenerative change of the left hip. There are subtle findings suggesting a subcapital left femoral neck fracture there are degenerative changes of the spine. Visualized portions of the right hip arthroplasty are intact. IMPRESSION: Subtle findings suggesting left subcapital femoral neck fracture. CT or MRI may be helpful for confirmation. Electronically Signed   By: Elberta Fortis M.D.   On: 06/20/2018 19:02    ASSESSMENT AND PLAN:   Active Problems:   Hepatic encephalopathy (HCC)   Palliative care by specialist   DNR (do not resuscitate)   Adult failure to thrive   Confusion   Weakness generalized  *Acute encephalopathy Likely hepatic encephalopathy Also could be worsening of his dementia or a new stroke  CT scan of the head and cervical spine is negative for any acute internal injuries, had some superficial laceration noted. Patient had some old strokes.  try to rule out infections.  X-ray chest is negative. Negative urinalysis.  For borderline ammonia- on lactulose.  Ammonia level improved and patient is back to baseline mentally today. His TSH, vitamin B12, folate, RPR all negative.  Palliative care consult for recurrent admissions and worsening in condition. I had discussed with patient's wife, if he does not improve much we will have to focus on getting him to the rehab center with palliative care following there.  *Bilateral upper extremity weakness This is a new finding, CT head was done yesterday which was negative for any bleed. As it persisted, call neurology consult.  They suggested to repeat CT cervical spine and angiogram of chest as patient has weak pulses bilateral upper extremity also. He will also need to have MRI of cervical spine which could not be done here because of his  pacemaker. [facility] had done MRI in the past on him and they have accepted  him for transfer for further care. However neurologist had also spoken to neurologist at [facility] should be transferred to either [facility] or [facility] which I were should be sooner.  *Hyponatremia IV fluids.  *Acute renal failure on CKD stage III Baseline creatinine appears around 1.5 Avoid nephrotoxic medications and monitor tomorrow. Improving.  *Chronic systolic congestive heart failure Ejection fraction 35% Currently monitor, no signs of exacerbation at this time.  *Atrial fibrillation On Eliquis currently.  hold today as we do not know if he had bleed or any abnormalities in his vasculature causing this new weakness.  *Hypothyroidism TSH normal.  All the records are reviewed and case discussed with Care Management/Social Workerr. Management plans discussed with the patient, family and they are in agreement.  CODE STATUS: DNR  TOTAL TIME TAKING CARE OF THIS PATIENT:85  minutes.  Spoke to patient's wife, neurologist, [facility] transfer center multiple times and updated about the plan.  POSSIBLE D/C IN 2-3 DAYS, DEPENDING ON CLINICAL CONDITION.   Altamese Dilling M.D on 06/22/2018   Between 7am to 6pm - Pager - [phone]  After 6pm go to [email or web address] - password [email or web address]  [facility]  Office  [phone]  CC: Primary care physician; Barnett Hatter, MD  Note: This dictation was prepared with Dragon dictation along with smaller phrase technology. Any transcriptional errors that result from this process are unintentional.

## 2018-06-22 NOTE — Evaluation (Signed)
Occupational Therapy Evaluation Patient Details Name: Leonard Welch MRN: [id number] DOB: 17-Sep-1941 Today's Date: 06/22/2018    History of Present Illness 77yo male pt presented to ER after fall, noted with AMS (and laceration to forehead); admitted for management of hepatic encephalopathy.  Of note, initial CT head and c-spine, L hip x-ray negative for acute injury   Clinical Impression   Pt seen for OT evaluation this date. Prior to hospital admission, pt had been at new ALF for 2 days when he began experiencing falls and then was admitted.  Currently pt demonstrates impairments in strength, A/PROM BUE, impaired sensation in all extremities (arms distal of elbow), UE functional use, coordination, and cognition (has cognitive deficits at baseline as well as L HH from previous CVA). Pt now requires total assist for bed level ADL and mobility deferred pending further neuro work up. Pt/spouse educated in positioning of BUE to maximize safety, joint protection, and skin integrity. Pt would benefit from skilled OT to address noted impairments and functional limitations (see below for any additional details) in order to maximize safety and independence while minimizing falls risk and caregiver burden.  Upon hospital discharge, recommend pt discharge to [facility].    Follow Up Recommendations  SNF    Equipment Recommendations  Other (comment)(TBD)    Recommendations for Other Services       Precautions / Restrictions Precautions Precaution Comments: contact iso Restrictions Weight Bearing Restrictions: No      Mobility Bed Mobility               General bed mobility comments: deferred pending neurology consult and potent for additional c-spine work up  Transfers                 General transfer comment: deferred pending neurology consult and potent for additional c-spine work up    Balance                                           ADL either performed or  assessed with clinical judgement   ADL Overall ADL's : Needs assistance/impaired                                       General ADL Comments: Pt is currently bed level total assist for all ADL tasks      Vision Patient Visual Report: No change from baseline;Other (comment)(endorses LHH at baseline from past CVA)       Perception     Praxis      Pertinent Vitals/Pain Pain Assessment: No/denies pain Faces Pain Scale: Hurts little more Pain Location: per spouse, just had a morphine shot approx 1 hour prior, before pt reports he had pain in his legs Pain Descriptors / Indicators: Grimacing Pain Intervention(s): Limited activity within patient's tolerance;Monitored during session;Premedicated before session     Hand Dominance Right   Extremity/Trunk Assessment Upper Extremity Assessment Upper Extremity Assessment: RUE deficits/detail;LUE deficits/detail RUE Deficits / Details: R shoulder elevation 3-/5, shoulder flex/abduct and R elbow flex/ext barely 1/5; otherwise 0/5.   Appears with sensory deficit elbows distally. RUE distal to elbow cold in temp to touch LUE Deficits / Details: L shoulder flex/adduct 1/5, otherwise 0/5. Questionable tone in elbow ext > flex L UE.  Appears with sensory deficit elbows distally.  LUE distal  to elbow cold in temp to touch   Lower Extremity Assessment Lower Extremity Assessment: Generalized weakness(grossly 3-/5 throughout bilat LEs; increased time/attention to task required. Baseline neuropathy bilat LEs, mid-calf distally)       Communication Communication Communication: No difficulties;HOH   Cognition Arousal/Alertness: (rests with eyes closed, but opens to voice and light touch) Behavior During Therapy: WFL for tasks assessed/performed Overall Cognitive Status: History of cognitive impairments - at baseline                                 General Comments: oriented to self, generally to hospital; follows  simple 1 step command with verbal and tactile cues   General Comments  scabs/wounds on BUE from neuropathy per spouse and pt "picks at them"; scab on bridge of nose 2/2 fall per spouse    Exercises Other Exercises Other Exercises: pt/spouse educated in positioning of BUE to maximize safety, joint protection, and skin integrity   Shoulder Instructions      Home Living Family/patient expects to be discharged to:: Assisted living(Just transitioned to [facility]; resident for approx 2 days prior to this admission)                             Home Equipment: Walker - 4 wheels          Prior Functioning/Environment Level of Independence: Needs assistance        Comments: Recently completed course of rehab after hospitalization at outside facility; ambulatory with [facility], assist from staff for ADLs.  Does ambulate to/from dining hall for meals, but escorted/assisted by staff as needed. Pt is a retired Paediatric nurse and enjoys playing cards weekly with friends/spouse        OT Problem List: Decreased strength;Impaired vision/perception;Decreased knowledge of use of DME or AE;Impaired tone;Decreased range of motion;Decreased coordination;Impaired UE functional use;Decreased cognition;Impaired sensation;Pain      OT Treatment/Interventions: Self-care/ADL training;Balance training;Therapeutic exercise;Therapeutic activities;Neuromuscular education;DME and/or AE instruction;Patient/family education    OT Goals(Current goals can be found in the care plan section) Acute Rehab OT Goals Patient Stated Goal: per wife, to figure out what's going on OT Goal Formulation: With patient/family Time For Goal Achievement: 07/06/18 Potential to Achieve Goals: Good ADL Goals Pt Will Perform Eating: with max assist;sitting;with adaptive utensils Pt Will Perform Grooming: with max assist;with adaptive equipment;sitting  OT Frequency: Min 1X/week   Barriers to D/C:             Co-evaluation              AM-PAC PT "6 Clicks" Daily Activity     Outcome Measure Help from another person eating meals?: Total Help from another person taking care of personal grooming?: Total Help from another person toileting, which includes using toliet, bedpan, or urinal?: Total Help from another person bathing (including washing, rinsing, drying)?: Total Help from another person to put on and taking off regular upper body clothing?: Total Help from another person to put on and taking off regular lower body clothing?: Total 6 Click Score: 6   End of Session    Activity Tolerance: Patient tolerated treatment well Patient left: in bed;with call bell/phone within reach;with bed alarm set;with family/visitor present(telesitter)  OT Visit Diagnosis: Other abnormalities of gait and mobility (R26.89);Other symptoms and signs involving the nervous system (R29.898);Other symptoms and signs involving cognitive function;Repeated falls (R29.6);Muscle weakness (generalized) (M62.81);Low vision,  both eyes (H54.2);Pain Pain - Right/Left: Right(and L) Pain - part of body: Arm;Shoulder;Leg                Time: 2956-2130 OT Time Calculation (min): 21 min Charges:  OT General Charges $OT Visit: 1 Visit OT Evaluation $OT Eval High Complexity: 1 High  Richrd Prime, MPH, MS, OTR/L ascom [phone] 06/22/18, 4:08 PM

## 2018-06-22 NOTE — Consult Note (Signed)
Reason for Consult: Bilateral upper limb weakness Referring Physician: Barbaraann Rondo, MD  CC: Mechanical fall  HPI: Leonard Welch is an 77 y.o. 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  He was brought in by EMS from [facility] with unwitnessed fall that occurred on 06/21/2018.  Per ED reports, he was recently seen in the ED the day before on10/09/2017 due to a fall with a laceration to the head. Patient states that he was transferring from wheelchair to bed when the wheelchair rolled backwards causing him to fall into a door hitting his back, left hip and right elbow. He was discharged but came back with another fall. Patient's wife who is currently at the bedside state that since the falls he developed sudden weakness and loss of function in his bilateral upper extremities. He is unable to grip or move his arms against gravity. He was playing cards on Tuesday with wife without difficulties. He has had CT head on both occasion showing no acute  abnormality other than ordered right PCA stroke and possible hemangioma the right frontal calvarium. He also had CT cervical spine which did not show evidence of fracture.   Past Medical History:  Diagnosis Date  . Cardiac pacemaker   . CKD (chronic kidney disease)   . Diabetes mellitus without complication (HCC)   . Encephalopathy   . Gout   . Heart disease     Past Surgical History:  Procedure Laterality Date  . APPENDECTOMY    . CARDIOVERSION    . PACEMAKER PLACEMENT    . TONSILLECTOMY    . TOTAL HIP ARTHROPLASTY Right     History reviewed. No pertinent family history.  Social History:  reports that he has never smoked.  He has never used smokeless tobacco. He reports that he drinks alcohol. He reports that he does not use drugs.  Allergies  Allergen Reactions  . Amiodarone   . Warfarin And Related     Medications:  I have reviewed the patient's current medications. Prior to Admission:  Medications Prior to Admission  Medication Sig Dispense Refill Last Dose  . acetaminophen (TYLENOL) 325 MG tablet Take 650 mg by mouth every 4 (four) hours as needed.   prn at prn  . allopurinol (ZYLOPRIM) 300 MG tablet Take 300 mg by mouth daily.   06/20/2018 at 0900  . apixaban (ELIQUIS) 2.5 MG TABS tablet Take 2.5 mg by mouth 2 (two) times daily.   06/20/2018 at 2100  . bumetanide (BUMEX) 1 MG tablet Take 1 mg by mouth daily as needed.   prn at prn  . bumetanide (BUMEX) 2 MG tablet Take 2 mg by mouth daily.   06/20/2018 at 0900  . docusate sodium (COLACE) 100 MG capsule Take 100 mg by mouth 2 (two) times daily.   06/20/2018 at 2100  . glucosamine-chondroitin 500-400 MG tablet Take 1 tablet by mouth 2 (two) times daily.   06/20/2018 at 2100  . insulin aspart (NOVOLOG) 100 UNIT/ML injection Inject 10 Units into the skin 3 (three) times daily with meals.   06/20/2018 at 1730  . insulin glargine (LANTUS) 100 UNIT/ML injection Inject 18 Units into the skin at bedtime.   06/20/2018 at 2100  . levothyroxine (SYNTHROID, LEVOTHROID) 175 MCG tablet Take 175  mcg by mouth daily before breakfast.   06/20/2018 at 0600  . Melatonin 5 MG TABS Take 1 tablet by mouth at bedtime as needed.   prn at prn  . metoprolol tartrate (LOPRESSOR) 25 MG tablet Take 25 mg by mouth 2 (two) times daily.   06/20/2018 at 2100  . Multiple Vitamins-Minerals (CERTAVITE SENIOR/ANTIOXIDANT) TABS Take 1 tablet by mouth daily.   06/20/2018 at 0900  . nystatin (NYSTATIN) powder Apply 1 g topically 2 (two) times daily.   06/20/2018 at 2200  . polyethylene glycol (MIRALAX / GLYCOLAX) packet Take 17 g by mouth daily.   06/20/2018 at 2100  . pravastatin (PRAVACHOL) 10 MG tablet  Take 10 mg by mouth at bedtime.   06/20/2018 at 2100  . senna (SENOKOT) 8.6 MG tablet Take 1 tablet by mouth 2 (two) times daily.   06/20/2018 at 2100  . spironolactone (ALDACTONE) 25 MG tablet Take 25 mg by mouth daily.   06/20/2018 at 0900  . sulfamethoxazole-trimethoprim (BACTRIM DS,SEPTRA DS) 800-160 MG tablet Take 1 tablet by mouth 2 (two) times daily.   06/20/2018 at 2100   Scheduled: . apixaban  2.5 mg Oral BID  . bacitracin   Topical BID  . bumetanide  2 mg Oral Daily  . [START ON 06/23/2018] Chlorhexidine Gluconate Cloth  6 each Topical Q0600  . insulin aspart  0-15 Units Subcutaneous TID WC  . insulin aspart  0-5 Units Subcutaneous QHS  . insulin glargine  18 Units Subcutaneous QHS  . lactulose  30 g Oral TID  . lactulose  30 g Oral Once  . levothyroxine  175 mcg Oral QAC breakfast  . metoprolol tartrate  25 mg Oral BID  . mupirocin ointment  1 application Nasal BID  . polyethylene glycol  17 g Oral Daily  . potassium chloride  20 mEq Oral BID  . pravastatin  10 mg Oral QHS  . spironolactone  25 mg Oral Daily  . sulfamethoxazole-trimethoprim  1 tablet Oral BID    ROS: History obtained from the patient   General ROS: negative for - chills, fatigue, fever, night sweats, weight gain or weight loss Psychological ROS: negative for - behavioral disorder, hallucinations, mood swings or suicidal ideation. Positive for memory difficulties. Ophthalmic ROS: negative for - blurry vision, double vision, eye pain or loss of vision ENT ROS: negative for - epistaxis, nasal discharge, oral lesions, sore throat, tinnitus or vertigo Allergy and Immunology ROS: negative for - hives or itchy/watery eyes Hematological and Lymphatic ROS: negative for - bleeding problems, bruising or swollen lymph nodes Endocrine ROS: negative for - galactorrhea, hair pattern changes, polydipsia/polyuria or temperature intolerance Respiratory ROS: negative for - cough, hemoptysis, shortness of breath or  wheezing Cardiovascular ROS: negative for - chest pain, dyspnea on exertion, edema or irregular heartbeat Gastrointestinal ROS: negative for - abdominal pain, diarrhea, hematemesis, nausea/vomiting or stool incontinence Genito-Urinary ROS: negative for - dysuria, hematuria, incontinence or urinary frequency/urgency Musculoskeletal ROS: negative for - joint swelling. Positive for muscular weakness Neurological ROS: as noted in HPI Dermatological ROS: Positive for bruising  Physical Examination: Blood pressure 105/65, pulse 83, temperature (!) 97.5 F (36.4 C), temperature source Oral, resp. rate 16, height 5\' 10"  (1.778 m), weight 102.1 kg, SpO2 100 %.  HEENT-  Normocephalic, no lesions, without obvious abnormality.  Normal external eye and conjunctiva.  Normal TM's bilaterally.  Normal auditory canals and external ears. Normal external nose, mucus membranes and septum.  Normal pharynx. Cardiovascular- S1, S2 normal, pulses palpable but thready  peripheral pulses   Lungs- chest clear, no wheezing, rales, normal symmetric air entry Abdomen- soft, non-tender; bowel sounds normal; no masses,  no organomegaly Extremities- no edema Lymph-no adenopathy palpable Musculoskeletal-no joint tenderness, deformity or swelling Skin- cool to touch with multiple ecchymosis and bruising in face, bilateral upper and lower extremities.  Neurological Exam   Mental Status: Alert, oriented, thought content appropriate.  Speech fluent without evidence of aphasia.  Able to follow 3 step commands without difficulty. Attention span and concentration seemed appropriate  Cranial Nerves: II: Discs flat bilaterally; Visual fields cut on the left. pupils equal, round, reactive to light and accommodation III,IV, VI: ptosis not present, extra-ocular motions intact bilaterally V,VII: smile symmetric, facial light touch sensation intact VIII: hearing normal bilaterally IX,X: gag reflex present XI: bilateral shoulder  shrug XII: midline tongue extension Motor: Right :  Upper extremity   0-1/5                   Left: Upper extremity   0-1/5 Right:   Lower extremity   4/5                      Left: Lower extremity   4/5 Tone and bulk:normal tone throughout; no atrophy noted Sensory: Pinprick and light touch decreased in both upper and lower extremities Deep Tendon Reflexes: 2+ and symmetric throughout Plantars: Right: mute                              Left: upgoing Cerebellar: Unable to assess Gait: not tested due to safety concerns  Data Reviewed  Laboratory Studies:   Basic Metabolic Panel: Recent Labs  Lab 06/21/18 0525 06/21/18 0727 06/22/18 0559  NA 134*  --  141  K 4.6  --  3.3*  CL 97*  --  106  CO2 23  --  27  GLUCOSE 176*  --  154*  BUN 32*  --  31*  CREATININE 2.14*  --  1.90*  CALCIUM 10.0  --  9.9  MG  --  2.2  --   PHOS  --  2.9  --     Liver Function Tests: Recent Labs  Lab 06/21/18 0525  AST 45*  ALT 21  ALKPHOS 224*  BILITOT 2.2*  PROT 7.9  ALBUMIN 3.6   No results for input(s): LIPASE, AMYLASE in the last 168 hours. Recent Labs  Lab 06/21/18 0644 06/22/18 0835  AMMONIA 36* 11    CBC: Recent Labs  Lab 06/21/18 0525  WBC 10.1  HGB 15.3  HCT 44.2  MCV 93.3  PLT 201    Cardiac Enzymes: No results for input(s): CKTOTAL, CKMB, CKMBINDEX, TROPONINI in the last 168 hours.  BNP: Invalid input(s): POCBNP  CBG: Recent Labs  Lab 06/21/18 1217 06/21/18 1650 06/21/18 2039 06/22/18 0920 06/22/18 1219  GLUCAP 152* 127* 147* 114* 168*    Microbiology: Results for orders placed or performed during the hospital encounter of 06/21/18  MRSA PCR Screening     Status: Abnormal   Collection Time: 06/21/18 10:21 AM  Result Value Ref Range Status   MRSA by PCR POSITIVE (A) NEGATIVE Final    Comment:        The GeneXpert MRSA Assay (FDA approved for NASAL specimens only), is one component of a comprehensive MRSA colonization surveillance program. It  is not intended to diagnose MRSA infection nor to guide or monitor treatment for MRSA infections.  RESULT CALLED TO, READ BACK BY AND VERIFIED WITH: BERENICE AJEJO AT 1154 ON 06/21/2018, KP Performed at [facility], [address] [place], [place] [address]     Coagulation Studies: Recent Labs    06/21/18 0727  LABPROT 16.4*  INR 1.33    Urinalysis:  Recent Labs  Lab 06/21/18 1630  COLORURINE YELLOW*  LABSPEC 1.013  PHURINE 5.0  GLUCOSEU NEGATIVE  HGBUR MODERATE*  BILIRUBINUR NEGATIVE  KETONESUR NEGATIVE  PROTEINUR NEGATIVE  NITRITE NEGATIVE  LEUKOCYTESUR TRACE*    Lipid Panel:  No results found for: CHOL, TRIG, HDL, CHOLHDL, VLDL, LDLCALC  HgbA1C: No results found for: HGBA1C  Urine Drug Screen:  No results found for: LABOPIA, COCAINSCRNUR, LABBENZ, AMPHETMU, THCU, LABBARB  Alcohol Level: No results for input(s): ETH in the last 168 hours.  Other results: EKG: atrial fibrillation, rate 113  Imaging: Dg Chest 1 View  Result Date: 06/21/2018 CLINICAL DATA:  Acute mental status changes with confusion. Indwelling cardiac pacemaker. Current history of chronic kidney disease and diabetes. EXAM: Portable CHEST 1 VIEW COMPARISON:  None. FINDINGS: Cardiac silhouette markedly enlarged even allowing for AP portable technique. Thoracic aorta atherosclerotic. Hilar and mediastinal contours otherwise unremarkable. LEFT subclavian single lead transvenous pacemaker intact. Mild pulmonary venous hypertension without overt edema. Elevation of the LEFT hemidiaphragm with mild atelectasis at the LEFT lung base. Lungs otherwise clear. No visible pleural effusions. IMPRESSION: 1. Mild LEFT basilar atelectasis with elevation of the LEFT hemidiaphragm. No acute cardiopulmonary disease otherwise. 2. Marked cardiomegaly. Mild pulmonary venous hypertension without evidence of overt pulmonary edema currently. Electronically Signed   By: Hulan Saas M.D.   On: 06/21/2018 13:45    Ct Head Wo Contrast  Result Date: 06/21/2018 CLINICAL DATA:  Fall, forehead laceration. History of encephalopathy. EXAM: CT HEAD WITHOUT CONTRAST TECHNIQUE: Contiguous axial images were obtained from the base of the skull through the vertex without intravenous contrast. COMPARISON:  CT HEAD June 21, 2018 at 0456 hours FINDINGS: Motion degraded examination. BRAIN: No intraparenchymal hemorrhage, mass effect, midline shift or acute large vascular territory infarcts. Mesial RIGHT temporal occipital lobe encephalomalacia with mild ex vacuo dilatation RIGHT ventricle atrium. No hydrocephalus. Patchy supratentorial white matter hypodensities. No abnormal extra-axial fluid collections. VASCULAR: Moderate calcific atherosclerosis of the carotid siphons. SKULL: No skull fracture. 1 cm atypical lytic lesion RIGHT frontal calvarium (axial image 42). No significant scalp soft tissue swelling. Metallic foreign bodies LEFT periauricular soft tissues. SINUSES/ORBITS: Trace paranasal sinus mucosal thickening. Mastoid air cells are well aerated.The included ocular globes and orbital contents are non-suspicious. OTHER: None. IMPRESSION: 1. Motion degraded examination.  No acute intracranial process. 2. 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 3. 1 cm lytic lesion RIGHT frontal calvarium could reflect atypical hemangioma though, metastatic disease not excluded. Given presence of LEFT periauricular metallic foreign bodies, MRI may be nondiagnostic though, not contraindicated. Electronically Signed   By: Awilda Metro M.D.   On: 06/21/2018 18:12   Ct Head Wo Contrast  Result Date: 06/21/2018 CLINICAL DATA:  77 y/o M; fall with laceration to the head, confusion, altered mental status. EXAM: CT HEAD WITHOUT CONTRAST CT CERVICAL SPINE WITHOUT CONTRAST TECHNIQUE: Multidetector CT imaging of the head and cervical spine was performed following the standard protocol without intravenous contrast. Multiplanar CT image  reconstructions of the cervical spine were also generated. COMPARISON:  06/20/2018 CT head. FINDINGS: CT HEAD FINDINGS Brain: No evidence of acute infarction, hemorrhage, hydrocephalus, extra-axial collection or mass lesion/mass effect. Right occipital lobe chronic infarction. Very small chronic  infarction in right frontal white matter. Moderate volume loss of the brain. Vascular: Calcific atherosclerosis of carotid siphons. Skull: Mild left frontal scalp contusion. No calvarial fracture identified. Sinuses/Orbits: No acute finding. Other: None. CT CERVICAL SPINE FINDINGS Alignment: Normal. Skull base and vertebrae: No acute fracture. No primary bone lesion or focal pathologic process. Soft tissues and spinal canal: Postsurgical changes within the left anterior neck. Disc levels: Multilevel disc and facet degenerative changes prominent endplate marginal osteophytes greatest at C6-7. Uncovertebral and facet hypertrophy results in bilateral C3-4, left C4-5, bilateral C5-6, bilateral C6-7 foraminal stenosis. Multilevel mild to moderate canal stenosis. Upper chest: Calcified granulomas in the lung apices. Other: Negative. IMPRESSION: 1. Mild left frontal scalp contusion. No calvarial fracture. 2. No acute intracranial abnormality identified. 3. No acute fracture or dislocation of the cervical spine identified. 4. 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 5. Moderate cervical spondylosis greatest at C6-7 level. Electronically Signed   By: Mitzi Hansen M.D.   On: 06/21/2018 05:56   Ct Head Wo Contrast  Result Date: 06/20/2018 CLINICAL DATA:  Unwitnessed fall, trauma EXAM: CT HEAD WITHOUT CONTRAST TECHNIQUE: Contiguous axial images were obtained from the base of the skull through the vertex without intravenous contrast. COMPARISON:  None. FINDINGS: Brain: Brain atrophy noted with chronic white matter microvascular changes throughout both cerebral hemispheres. Medial  right occipital lobe encephalomalacia posteriorly compatible with a previous right PCA territory infarct. No acute intracranial hemorrhage, new mass lesion, new infarction, midline shift, herniation, hydrocephalus, or extra-axial fluid collection. No focal mass effect or edema. Cisterns are patent. Cerebellar atrophy as well. Vascular: Intracranial atherosclerosis noted.  No hyperdense vessel. Skull: Normal. Negative for fracture or focal lesion. Sinuses/Orbits: No acute finding. Other: None. IMPRESSION: Atrophy and chronic white matter microvascular changes. Remote right occipital PCA territory infarct No acute intracranial abnormality by noncontrast CT. Electronically Signed   By: Judie Petit.  Shick M.D.   On: 06/20/2018 18:51   Ct Cervical Spine Wo Contrast  Result Date: 06/21/2018 CLINICAL DATA:  77 y/o M; fall with laceration to the head, confusion, altered mental status. EXAM: CT HEAD WITHOUT CONTRAST CT CERVICAL SPINE WITHOUT CONTRAST TECHNIQUE: Multidetector CT imaging of the head and cervical spine was performed following the standard protocol without intravenous contrast. Multiplanar CT image reconstructions of the cervical spine were also generated. COMPARISON:  06/20/2018 CT head. FINDINGS: CT HEAD FINDINGS Brain: No evidence of acute infarction, hemorrhage, hydrocephalus, extra-axial collection or mass lesion/mass effect. Right occipital lobe chronic infarction. Very small chronic infarction in right frontal white matter. Moderate volume loss of the brain. Vascular: Calcific atherosclerosis of carotid siphons. Skull: Mild left frontal scalp contusion. No calvarial fracture identified. Sinuses/Orbits: No acute finding. Other: None. CT CERVICAL SPINE FINDINGS Alignment: Normal. Skull base and vertebrae: No acute fracture. No primary bone lesion or focal pathologic process. Soft tissues and spinal canal: Postsurgical changes within the left anterior neck. Disc levels: Multilevel disc and facet degenerative  changes prominent endplate marginal osteophytes greatest at C6-7. Uncovertebral and facet hypertrophy results in bilateral C3-4, left C4-5, bilateral C5-6, bilateral C6-7 foraminal stenosis. Multilevel mild to moderate canal stenosis. Upper chest: Calcified granulomas in the lung apices. Other: Negative. IMPRESSION: 1. Mild left frontal scalp contusion. No calvarial fracture. 2. No acute intracranial abnormality identified. 3. No acute fracture or dislocation of the cervical spine identified. 4. 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 5. Moderate cervical spondylosis greatest at C6-7 level. Electronically Signed  By: Mitzi Hansen M.D.   On: 06/21/2018 05:56   Ct Hip Left Wo Contrast  Result Date: 06/20/2018 CLINICAL DATA:  Pt to ED via EMS from [facility] c/o unwitnessed fall today, patient states was transferring from wheelchair to bed when the wheelchair went backwards causing patient to fall into a door hitting his back and left hip. Denies hitting head or LOC EXAM: CT OF THE LEFT HIP WITHOUT CONTRAST TECHNIQUE: Multidetector CT imaging of the left hip was performed according to the standard protocol. Multiplanar CT image reconstructions were also generated. COMPARISON:  Current left hip radiographs. FINDINGS: Bones/Joint/Cartilage No fracture. There is a small collar of osteophytes along the base of the left femoral head accounting for the impression of a possible fracture on the standard radiographs. No bone lesion. Left hip joint is normally aligned. There is concentric hip joint space narrowing with greater narrowing along superolateral joint space. No convincing joint effusion. Ligaments Suboptimally assessed by CT. Muscles and Tendons The muscles and tendons inserting along the proximal appear intact. No muscle contusion or hematoma on the included field. Soft tissues No soft tissue mass or significant contusion or hematoma. No acute findings noted  in the visualized portions of the left pelvis. IMPRESSION: 1. No fracture. Specifically, no evidence of a left femoral neck fracture. No acute findings. 2. Degenerative/arthropathic changes of the left hip joint with a small collar osteophytes along the base of the femoral head. Electronically Signed   By: Amie Portland M.D.   On: 06/20/2018 19:54   Dg Hip Unilat W Or Wo Pelvis 2-3 Views Left  Result Date: 06/20/2018 CLINICAL DATA:  Unwitnessed fall today while transferring from the wheelchair to bed with left hip pain. EXAM: DG HIP (WITH OR WITHOUT PELVIS) 2-3V LEFT COMPARISON:  None. FINDINGS: Mild diffuse osteopenia. Mild degenerative change of the left hip. There are subtle findings suggesting a subcapital left femoral neck fracture there are degenerative changes of the spine. Visualized portions of the right hip arthroplasty are intact. IMPRESSION: Subtle findings suggesting left subcapital femoral neck fracture. CT or MRI may be helpful for confirmation. Electronically Signed   By: Elberta Fortis M.D.   On: 06/20/2018 19:02   Assessment: 77 y.o. 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  Was able to move upper extremities on presentation but at some point yesterday evening was unable to move upper extremities.  Hands cold and pulses decreased in the upper extremities as well.  Repeat head CT reviewed and shows no acute changes.  Initial CT of the cervical spine was unremarkable.  Patient unable to have MRI of the brain due to pacer.  Patient on Eliquis.    Plan: 1.  Repeat CT of the cervical spine STAT 2.  CT of the chest with contrast STAT 3.  If above  unremarkable patient will need transfer for MRI of the cervical spine.     Thana Farr, MD Neurology [phone] 06/22/2018, 4:27 PM

## 2018-06-23 ENCOUNTER — Inpatient Hospital Stay (HOSPITAL_COMMUNITY)
Admission: AD | Admit: 2018-06-23 | Discharge: 2018-06-26 | DRG: 052 | Disposition: A | Discharge status: Hospice | Payer: Medicare Other | Source: Other Acute Inpatient Hospital | Attending: Internal Medicine | Admitting: Internal Medicine

## 2018-06-23 ENCOUNTER — Encounter (HOSPITAL_COMMUNITY): Payer: Self-pay | Admitting: Internal Medicine

## 2018-06-23 ENCOUNTER — Inpatient Hospital Stay (HOSPITAL_COMMUNITY): Payer: Medicare Other

## 2018-06-23 DIAGNOSIS — K7682 Hepatic encephalopathy: Secondary | ICD-10-CM | POA: Diagnosis present

## 2018-06-23 DIAGNOSIS — G9341 Metabolic encephalopathy: Secondary | ICD-10-CM | POA: Diagnosis present

## 2018-06-23 DIAGNOSIS — N179 Acute kidney failure, unspecified: Secondary | ICD-10-CM | POA: Diagnosis present

## 2018-06-23 DIAGNOSIS — E039 Hypothyroidism, unspecified: Secondary | ICD-10-CM | POA: Diagnosis not present

## 2018-06-23 DIAGNOSIS — S14123A Central cord syndrome at C3 level of cervical spinal cord, initial encounter: Principal | ICD-10-CM | POA: Diagnosis present

## 2018-06-23 DIAGNOSIS — F0390 Unspecified dementia without behavioral disturbance: Secondary | ICD-10-CM | POA: Diagnosis present

## 2018-06-23 DIAGNOSIS — R000 Tachycardia, unspecified: Secondary | ICD-10-CM | POA: Diagnosis not present

## 2018-06-23 DIAGNOSIS — E1122 Type 2 diabetes mellitus with diabetic chronic kidney disease: Secondary | ICD-10-CM | POA: Diagnosis not present

## 2018-06-23 DIAGNOSIS — I639 Cerebral infarction, unspecified: Secondary | ICD-10-CM | POA: Diagnosis not present

## 2018-06-23 DIAGNOSIS — E1151 Type 2 diabetes mellitus with diabetic peripheral angiopathy without gangrene: Secondary | ICD-10-CM | POA: Diagnosis present

## 2018-06-23 DIAGNOSIS — R7881 Bacteremia: Secondary | ICD-10-CM | POA: Diagnosis present

## 2018-06-23 DIAGNOSIS — G8320 Monoplegia of upper limb affecting unspecified side: Secondary | ICD-10-CM | POA: Diagnosis present

## 2018-06-23 DIAGNOSIS — I482 Chronic atrial fibrillation, unspecified: Secondary | ICD-10-CM | POA: Diagnosis present

## 2018-06-23 DIAGNOSIS — R0609 Other forms of dyspnea: Secondary | ICD-10-CM | POA: Diagnosis not present

## 2018-06-23 DIAGNOSIS — R0600 Dyspnea, unspecified: Secondary | ICD-10-CM

## 2018-06-23 DIAGNOSIS — K761 Chronic passive congestion of liver: Secondary | ICD-10-CM | POA: Diagnosis present

## 2018-06-23 DIAGNOSIS — N183 Chronic kidney disease, stage 3 unspecified: Secondary | ICD-10-CM | POA: Diagnosis present

## 2018-06-23 DIAGNOSIS — I2510 Atherosclerotic heart disease of native coronary artery without angina pectoris: Secondary | ICD-10-CM | POA: Diagnosis present

## 2018-06-23 DIAGNOSIS — L989 Disorder of the skin and subcutaneous tissue, unspecified: Secondary | ICD-10-CM | POA: Diagnosis present

## 2018-06-23 DIAGNOSIS — B9561 Methicillin susceptible Staphylococcus aureus infection as the cause of diseases classified elsewhere: Secondary | ICD-10-CM | POA: Diagnosis not present

## 2018-06-23 DIAGNOSIS — I130 Hypertensive heart and chronic kidney disease with heart failure and stage 1 through stage 4 chronic kidney disease, or unspecified chronic kidney disease: Secondary | ICD-10-CM | POA: Diagnosis present

## 2018-06-23 DIAGNOSIS — H53462 Homonymous bilateral field defects, left side: Secondary | ICD-10-CM | POA: Diagnosis present

## 2018-06-23 DIAGNOSIS — Z888 Allergy status to other drugs, medicaments and biological substances status: Secondary | ICD-10-CM

## 2018-06-23 DIAGNOSIS — E876 Hypokalemia: Secondary | ICD-10-CM | POA: Diagnosis not present

## 2018-06-23 DIAGNOSIS — N141 Nephropathy induced by other drugs, medicaments and biological substances: Secondary | ICD-10-CM | POA: Diagnosis not present

## 2018-06-23 DIAGNOSIS — W06XXXA Fall from bed, initial encounter: Secondary | ICD-10-CM | POA: Diagnosis not present

## 2018-06-23 DIAGNOSIS — R0682 Tachypnea, not elsewhere classified: Secondary | ICD-10-CM | POA: Diagnosis not present

## 2018-06-23 DIAGNOSIS — Z96641 Presence of right artificial hip joint: Secondary | ICD-10-CM | POA: Diagnosis present

## 2018-06-23 DIAGNOSIS — Z79899 Other long term (current) drug therapy: Secondary | ICD-10-CM

## 2018-06-23 DIAGNOSIS — T380X5A Adverse effect of glucocorticoids and synthetic analogues, initial encounter: Secondary | ICD-10-CM | POA: Diagnosis present

## 2018-06-23 DIAGNOSIS — R29898 Other symptoms and signs involving the musculoskeletal system: Secondary | ICD-10-CM

## 2018-06-23 DIAGNOSIS — Z7901 Long term (current) use of anticoagulants: Secondary | ICD-10-CM

## 2018-06-23 DIAGNOSIS — M109 Gout, unspecified: Secondary | ICD-10-CM | POA: Diagnosis present

## 2018-06-23 DIAGNOSIS — T502X5A Adverse effect of carbonic-anhydrase inhibitors, benzothiadiazides and other diuretics, initial encounter: Secondary | ICD-10-CM | POA: Diagnosis present

## 2018-06-23 DIAGNOSIS — R8290 Unspecified abnormal findings in urine: Secondary | ICD-10-CM | POA: Diagnosis present

## 2018-06-23 DIAGNOSIS — K7290 Hepatic failure, unspecified without coma: Secondary | ICD-10-CM | POA: Diagnosis present

## 2018-06-23 DIAGNOSIS — T508X5A Adverse effect of diagnostic agents, initial encounter: Secondary | ICD-10-CM | POA: Diagnosis not present

## 2018-06-23 DIAGNOSIS — I081 Rheumatic disorders of both mitral and tricuspid valves: Secondary | ICD-10-CM | POA: Diagnosis not present

## 2018-06-23 DIAGNOSIS — Z515 Encounter for palliative care: Secondary | ICD-10-CM | POA: Diagnosis present

## 2018-06-23 DIAGNOSIS — Z66 Do not resuscitate: Secondary | ICD-10-CM | POA: Diagnosis present

## 2018-06-23 DIAGNOSIS — M4802 Spinal stenosis, cervical region: Secondary | ICD-10-CM | POA: Diagnosis present

## 2018-06-23 DIAGNOSIS — I5042 Chronic combined systolic (congestive) and diastolic (congestive) heart failure: Secondary | ICD-10-CM | POA: Diagnosis present

## 2018-06-23 DIAGNOSIS — E785 Hyperlipidemia, unspecified: Secondary | ICD-10-CM | POA: Diagnosis present

## 2018-06-23 DIAGNOSIS — N184 Chronic kidney disease, stage 4 (severe): Secondary | ICD-10-CM | POA: Diagnosis present

## 2018-06-23 DIAGNOSIS — Z79890 Hormone replacement therapy (postmenopausal): Secondary | ICD-10-CM

## 2018-06-23 DIAGNOSIS — Z950 Presence of cardiac pacemaker: Secondary | ICD-10-CM

## 2018-06-23 DIAGNOSIS — Z23 Encounter for immunization: Secondary | ICD-10-CM | POA: Diagnosis present

## 2018-06-23 DIAGNOSIS — W19XXXA Unspecified fall, initial encounter: Secondary | ICD-10-CM | POA: Diagnosis not present

## 2018-06-23 DIAGNOSIS — Z8673 Personal history of transient ischemic attack (TIA), and cerebral infarction without residual deficits: Secondary | ICD-10-CM

## 2018-06-23 DIAGNOSIS — E1165 Type 2 diabetes mellitus with hyperglycemia: Secondary | ICD-10-CM | POA: Diagnosis not present

## 2018-06-23 DIAGNOSIS — E0590 Thyrotoxicosis, unspecified without thyrotoxic crisis or storm: Secondary | ICD-10-CM | POA: Diagnosis present

## 2018-06-23 DIAGNOSIS — Y92092 Bedroom in other non-institutional residence as the place of occurrence of the external cause: Secondary | ICD-10-CM | POA: Diagnosis not present

## 2018-06-23 DIAGNOSIS — M5412 Radiculopathy, cervical region: Secondary | ICD-10-CM | POA: Diagnosis present

## 2018-06-23 DIAGNOSIS — Z794 Long term (current) use of insulin: Secondary | ICD-10-CM

## 2018-06-23 DIAGNOSIS — R296 Repeated falls: Secondary | ICD-10-CM | POA: Diagnosis present

## 2018-06-23 DIAGNOSIS — E1129 Type 2 diabetes mellitus with other diabetic kidney complication: Secondary | ICD-10-CM | POA: Diagnosis present

## 2018-06-23 LAB — CBC WITH DIFFERENTIAL/PLATELET
ABS IMMATURE GRANULOCYTES: 0.1 10*3/uL (ref 0.0–0.1)
Basophils Absolute: 0.1 10*3/uL (ref 0.0–0.1)
Basophils Relative: 1 %
Eosinophils Absolute: 0.1 10*3/uL (ref 0.0–0.7)
Eosinophils Relative: 1 %
HCT: 50.8 % (ref 39.0–52.0)
HEMOGLOBIN: 16.5 g/dL (ref 13.0–17.0)
IMMATURE GRANULOCYTES: 1 %
LYMPHS ABS: 1.4 10*3/uL (ref 0.7–4.0)
LYMPHS PCT: 13 %
MCH: 31 pg (ref 26.0–34.0)
MCHC: 32.5 g/dL (ref 30.0–36.0)
MCV: 95.3 fL (ref 78.0–100.0)
Monocytes Absolute: 1.1 10*3/uL — ABNORMAL HIGH (ref 0.1–1.0)
Monocytes Relative: 10 %
NEUTROS ABS: 7.9 10*3/uL — AB (ref 1.7–7.7)
NEUTROS PCT: 74 %
Platelets: 210 10*3/uL (ref 150–400)
RBC: 5.33 MIL/uL (ref 4.22–5.81)
RDW: 16.5 % — ABNORMAL HIGH (ref 11.5–15.5)
WBC: 10.7 10*3/uL — AB (ref 4.0–10.5)

## 2018-06-23 LAB — COMPREHENSIVE METABOLIC PANEL
ALT: 18 U/L (ref 0–44)
ANION GAP: 15 (ref 5–15)
AST: 27 U/L (ref 15–41)
Albumin: 3.1 g/dL — ABNORMAL LOW (ref 3.5–5.0)
Alkaline Phosphatase: 194 U/L — ABNORMAL HIGH (ref 38–126)
BUN: 28 mg/dL — ABNORMAL HIGH (ref 8–23)
CALCIUM: 10.1 mg/dL (ref 8.9–10.3)
CHLORIDE: 101 mmol/L (ref 98–111)
CO2: 25 mmol/L (ref 22–32)
CREATININE: 2.11 mg/dL — AB (ref 0.61–1.24)
GFR, EST AFRICAN AMERICAN: 33 mL/min — AB (ref >60)
GFR, EST NON AFRICAN AMERICAN: 29 mL/min — AB (ref >60)
Glucose, Bld: 168 mg/dL — ABNORMAL HIGH (ref 70–99)
Potassium: 3.4 mmol/L — ABNORMAL LOW (ref 3.5–5.1)
SODIUM: 141 mmol/L (ref 135–145)
Total Bilirubin: 1.5 mg/dL — ABNORMAL HIGH (ref 0.3–1.2)
Total Protein: 7.6 g/dL (ref 6.5–8.1)

## 2018-06-23 LAB — GLUCOSE, CAPILLARY
GLUCOSE-CAPILLARY: 160 mg/dL — AB (ref 70–99)
Glucose-Capillary: 170 mg/dL — ABNORMAL HIGH (ref 70–99)
Glucose-Capillary: 155 mg/dL — ABNORMAL HIGH (ref 70–99)
Glucose-Capillary: 152 mg/dL — ABNORMAL HIGH (ref 70–99)
Glucose-Capillary: 178 mg/dL — ABNORMAL HIGH (ref 70–99)

## 2018-06-23 LAB — BRAIN NATRIURETIC PEPTIDE: B Natriuretic Peptide: 971.3 pg/mL — ABNORMAL HIGH (ref 0.0–100.0)

## 2018-06-23 LAB — MAGNESIUM: MAGNESIUM: 2.2 mg/dL (ref 1.7–2.4)

## 2018-06-23 LAB — URINE CULTURE: Culture: <10000 — AB

## 2018-06-23 MED ORDER — DEXAMETHASONE SODIUM PHOSPHATE 10 MG/ML IJ SOLN
10.0000 mg | Freq: Four times a day (QID) | INTRAMUSCULAR | Status: DC
  Administered 2018-06-23 – 2018-06-26 (×9): 10 mg via INTRAVENOUS
  Filled 2018-06-23 (×3): qty 1
  Filled 2018-06-23: qty 2.5
  Filled 2018-06-23 (×7): qty 1

## 2018-06-23 MED ORDER — ZOLPIDEM TARTRATE 5 MG PO TABS: 5.0000 mg | ORAL_TABLET | Freq: Every evening | ORAL | Status: DC | PRN

## 2018-06-23 MED ORDER — ADULT MULTIVITAMIN W/MINERALS CH
1.0000 | ORAL_TABLET | Freq: Every day | ORAL | Status: DC
  Administered 2018-06-23: 1 via ORAL
  Filled 2018-06-23: qty 1

## 2018-06-23 MED ORDER — HALOPERIDOL LACTATE 5 MG/ML IJ SOLN: 2.0000 mg | Freq: Once | INTRAMUSCULAR | Status: DC | PRN

## 2018-06-23 MED ORDER — LORAZEPAM 2 MG/ML IJ SOLN
0.5000 mg | Freq: Once | INTRAMUSCULAR | Status: DC | PRN
  Filled 2018-06-23: qty 1

## 2018-06-23 MED ORDER — HYDRALAZINE HCL 20 MG/ML IJ SOLN: 5.0000 mg | INTRAMUSCULAR | Status: DC | PRN

## 2018-06-23 MED ORDER — INSULIN GLARGINE 100 UNIT/ML ~~LOC~~ SOLN
9.0000 [IU] | Freq: Every day | SUBCUTANEOUS | Status: DC
  Filled 2018-06-23: qty 0.09

## 2018-06-23 MED ORDER — ONDANSETRON HCL 4 MG/2ML IJ SOLN: 4.0000 mg | Freq: Four times a day (QID) | INTRAMUSCULAR | Status: DC | PRN

## 2018-06-23 MED ORDER — LEVOTHYROXINE SODIUM 75 MCG PO TABS
175.0000 ug | ORAL_TABLET | Freq: Every day | ORAL | Status: DC
  Administered 2018-06-23 – 2018-06-26 (×3): 175 ug via ORAL
  Filled 2018-06-23 (×4): qty 1

## 2018-06-23 MED ORDER — SENNA 8.6 MG PO TABS
1.0000 | ORAL_TABLET | Freq: Two times a day (BID) | ORAL | Status: DC
  Administered 2018-06-23: 8.6 mg via ORAL
  Filled 2018-06-23: qty 1

## 2018-06-23 MED ORDER — ACETAMINOPHEN 650 MG RE SUPP: 650.0000 mg | Freq: Four times a day (QID) | RECTAL | Status: DC | PRN

## 2018-06-23 MED ORDER — METOPROLOL TARTRATE 25 MG PO TABS
25.0000 mg | ORAL_TABLET | Freq: Two times a day (BID) | ORAL | Status: DC
  Administered 2018-06-23 – 2018-06-25 (×4): 25 mg via ORAL
  Filled 2018-06-23 (×4): qty 1

## 2018-06-23 MED ORDER — POLYETHYLENE GLYCOL 3350 17 G PO PACK: 17.0000 g | PACK | Freq: Every day | ORAL | Status: DC | PRN

## 2018-06-23 MED ORDER — HEPARIN SODIUM (PORCINE) 5000 UNIT/ML IJ SOLN
5000.0000 [IU] | Freq: Three times a day (TID) | INTRAMUSCULAR | Status: DC
  Administered 2018-06-23 – 2018-06-26 (×9): 5000 [IU] via SUBCUTANEOUS
  Filled 2018-06-23 (×9): qty 1

## 2018-06-23 MED ORDER — NYSTATIN 100000 UNIT/GM EX POWD
1.0000 g | Freq: Two times a day (BID) | CUTANEOUS | Status: DC
  Administered 2018-06-23 – 2018-06-26 (×6): 1 g via TOPICAL
  Filled 2018-06-23 (×2): qty 15

## 2018-06-23 MED ORDER — INSULIN GLARGINE 100 UNIT/ML ~~LOC~~ SOLN
15.0000 [IU] | Freq: Every day | SUBCUTANEOUS | Status: DC
  Administered 2018-06-23 – 2018-06-24 (×2): 15 [IU] via SUBCUTANEOUS
  Filled 2018-06-23 (×2): qty 0.15

## 2018-06-23 MED ORDER — ACETAMINOPHEN 325 MG PO TABS: 650.0000 mg | ORAL_TABLET | Freq: Four times a day (QID) | ORAL | Status: DC | PRN

## 2018-06-23 MED ORDER — LORAZEPAM 2 MG/ML IJ SOLN
1.0000 mg | Freq: Once | INTRAMUSCULAR | Status: AC | PRN
  Administered 2018-06-23: 1 mg via INTRAVENOUS
  Filled 2018-06-23: qty 1

## 2018-06-23 MED ORDER — ALLOPURINOL 300 MG PO TABS
300.0000 mg | ORAL_TABLET | Freq: Every day | ORAL | Status: DC
  Administered 2018-06-23: 300 mg via ORAL
  Filled 2018-06-23: qty 1

## 2018-06-23 MED ORDER — BACITRACIN ZINC 500 UNIT/GM EX OINT
TOPICAL_OINTMENT | Freq: Two times a day (BID) | CUTANEOUS | Status: DC
  Administered 2018-06-23: 1 via TOPICAL
  Administered 2018-06-23 – 2018-06-26 (×6): via TOPICAL
  Filled 2018-06-23 (×2): qty 28.4

## 2018-06-23 MED ORDER — POTASSIUM CHLORIDE CRYS ER 20 MEQ PO TBCR: 20.0000 meq | EXTENDED_RELEASE_TABLET | Freq: Once | ORAL | Status: DC

## 2018-06-23 MED ORDER — SODIUM CHLORIDE 0.45 % IV SOLN
INTRAVENOUS | Status: DC
  Administered 2018-06-23 – 2018-06-26 (×5): via INTRAVENOUS
  Filled 2018-06-23: qty 1000

## 2018-06-23 MED ORDER — SODIUM CHLORIDE 0.9 % IV SOLN
1.0000 g | Freq: Every day | INTRAVENOUS | Status: DC
  Administered 2018-06-23 – 2018-06-26 (×4): 1 g via INTRAVENOUS
  Filled 2018-06-23 (×4): qty 10

## 2018-06-23 MED ORDER — INSULIN ASPART 100 UNIT/ML ~~LOC~~ SOLN
0.0000 [IU] | Freq: Three times a day (TID) | SUBCUTANEOUS | Status: DC
  Administered 2018-06-23 – 2018-06-24 (×4): 2 [IU] via SUBCUTANEOUS
  Administered 2018-06-24 (×2): 3 [IU] via SUBCUTANEOUS
  Administered 2018-06-25: 5 [IU] via SUBCUTANEOUS
  Administered 2018-06-25: 3 [IU] via SUBCUTANEOUS
  Administered 2018-06-25: 7 [IU] via SUBCUTANEOUS
  Administered 2018-06-26: 5 [IU] via SUBCUTANEOUS

## 2018-06-23 MED ORDER — PRAVASTATIN SODIUM 20 MG PO TABS: 10.0000 mg | ORAL_TABLET | Freq: Every day | ORAL | Status: DC

## 2018-06-23 MED ORDER — HALOPERIDOL LACTATE 5 MG/ML IJ SOLN
2.5000 mg | Freq: Once | INTRAMUSCULAR | Status: AC
  Administered 2018-06-23: 2.5 mg via INTRAVENOUS
  Filled 2018-06-23: qty 1

## 2018-06-23 MED ORDER — MORPHINE SULFATE (PF) 2 MG/ML IV SOLN
2.0000 mg | Freq: Once | INTRAVENOUS | Status: AC
  Administered 2018-06-23: 2 mg via INTRAVENOUS
  Filled 2018-06-23: qty 1

## 2018-06-23 MED ORDER — LORAZEPAM 2 MG/ML IJ SOLN
1.0000 mg | Freq: Once | INTRAMUSCULAR | Status: AC
  Administered 2018-06-23: 1 mg via INTRAVENOUS

## 2018-06-23 MED ORDER — METOPROLOL TARTRATE 5 MG/5ML IV SOLN
5.0000 mg | INTRAVENOUS | Status: DC | PRN
  Administered 2018-06-24: 5 mg via INTRAVENOUS
  Filled 2018-06-23 (×2): qty 5

## 2018-06-23 MED ORDER — ONDANSETRON HCL 4 MG PO TABS: 4.0000 mg | ORAL_TABLET | Freq: Four times a day (QID) | ORAL | Status: DC | PRN

## 2018-06-23 MED ORDER — DOCUSATE SODIUM 100 MG PO CAPS: 100.0000 mg | ORAL_CAPSULE | Freq: Two times a day (BID) | ORAL | Status: DC | PRN

## 2018-06-23 MED ORDER — LACTULOSE 10 GM/15ML PO SOLN
30.0000 g | Freq: Two times a day (BID) | ORAL | Status: DC
  Administered 2018-06-23 – 2018-06-25 (×4): 30 g via ORAL
  Filled 2018-06-23 (×4): qty 45

## 2018-06-23 MED ORDER — MELATONIN 3 MG PO TABS
4.5000 mg | ORAL_TABLET | Freq: Every evening | ORAL | Status: DC | PRN
  Administered 2018-06-25 (×2): 4.5 mg via ORAL
  Filled 2018-06-23 (×3): qty 1.5

## 2018-06-23 MED ORDER — LORAZEPAM 2 MG/ML IJ SOLN
0.5000 mg | Freq: Once | INTRAMUSCULAR | Status: AC
  Administered 2018-06-23: 0.5 mg via INTRAVENOUS
  Filled 2018-06-23: qty 1

## 2018-06-23 NOTE — Consult Note (Addendum)
NEURO HOSPITALIST CONSULT NOTE   Requestig physician: Dr. Maryland Pink   Reason for Consult:BUE weakness/ AMS   History obtained from:  chart  HPI:                                                                                                                                          Leonard Welch is an 77 y.o. male  With PMH significant for CVA, HTN, HLD, CKD-3 End stage liver diesease, a. 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  Per chart. patient: patient admitted to [facility] 06/21/18 for AMS after a fall. Patient stated  that he walked into a window and fell.  C/o of back and bilateral shoulder pain. Denies any other pain at this time. Since then his voice has not been the same. His voice goes in and out as he is trying to talk. initially treated for suspected hepatic encephalopathy with lactulose. His mental status improved and then he was found to have new onset LUE weakness. Denies an CP, SOB, difficulty swallowing. Patient is somewhat unreliable d/t mental status waxing and waning.   Hospital course: CT head and Cervical spine CT angio of chest. 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 Cardiac pacemaker   . CKD (chronic kidney disease)   . Diabetes mellitus without complication ([place])   . Encephalopathy   . Gout   . Heart disease     Past Surgical History:  Procedure Laterality Date  . APPENDECTOMY    . CARDIOVERSION    . PACEMAKER PLACEMENT    . TONSILLECTOMY    . TOTAL HIP ARTHROPLASTY Right     Social History:  reports that he has never smoked. He has never  used smokeless tobacco. He reports that he drinks alcohol. He reports that he does not use drugs.  Allergies  Allergen Reactions  . Amiodarone   . Warfarin And Related     MEDICATIONS:  Scheduled: . allopurinol  300 mg Oral Daily  . heparin injection (subcutaneous)  5,000 Units Subcutaneous Q8H  . insulin aspart  0-9 Units Subcutaneous TID WC  . insulin glargine  9 Units Subcutaneous QHS  . lactulose  30 g Oral BID  . levothyroxine  175 mcg Oral QAC breakfast  . metoprolol tartrate  25 mg Oral BID  . multivitamin with minerals  1 tablet Oral Daily  . nystatin  1 g Topical BID  . pravastatin  10 mg Oral QHS  . senna  1 tablet Oral BID   Continuous: . cefTRIAXone (ROCEPHIN)  IV 1 g (06/23/18 0543)   YTK:[id number] **OR** acetaminophen, docusate sodium, hydrALAZINE, Melatonin, ondansetron **OR** ondansetron (ZOFRAN) IV, polyethylene glycol, zolpidem   ROS:                                                                                                                                       ROS was performed and is negative except as noted in HP; however patient is unreliable historian d/t waxing and waning of mental status.   Blood pressure (!) 122/91, pulse 99, temperature (!) 97.5 F (36.4 C), temperature source Oral, resp. rate 19, height 6' (1.829 m), SpO2 98 %.   General Examination:                                                                                                       Physical Exam  HEENT-  Normocephalic, several abrasions, without obvious abnormality.  Normal external eye and conjunctiva.   Cardiovascular- S1-S2 audible, pulses palpable throughout   Lungs-no rhonchi or wheezing noted, no excessive working breathing.  Saturations within normal limits on 2 L [place] Abdomen- All BS present x 4 quadrants Extremities- Warm, dry multiple  abrasions Musculoskeletal- bilateral toes are contracted. Skin-warm and dry, multiple abrasions, pressure dressings on BLE noted.  Neurological Examination Mental Status: Alert, oriented to person/year/month.event. thought content appropriate.  Speech without evidence of aphasia.  Voice tends to go in and out as he is talking. Able to follow some commands without difficulty. Unable to grip hands bilaterally, raise arms or wiggle fingers bilaterally. Cranial Nerves: II:  L homonymous hemianopsia III,IV, VI: ptosis not present, extra-ocular motions intact bilaterally pupils equal, round, reactive to light and accommodation V,VII: smile asymmetric, slight left facial droop. facial light touch sensation normal bilaterally VIII: hearing normal bilaterally IX,X: uvula rises symmetrically XI: bilateral shoulder shrug XII: midline tongue extension Motor: Right : Upper extremity  1/5 Left:     Upper extremity   1/5  Lower extremity   4/5  Lower extremity   4/5 Tone and bulk:normal tone throughout; no atrophy noted Sensory: in BUE patient can feel painful stimulation above the elbow. Below the elbow patient did not respond to painful stimuli Deep Tendon Reflexes: 2+ and symmetric biceps and patella Plantars: Right: chronically downgoing  Left: chronically downgoing Cerebellar: UTA Gait: not tested   Lab Results: Basic Metabolic Panel: Recent Labs  Lab 06/21/18 0525 06/21/18 0727 06/22/18 0559 06/23/18 0620  NA 134*  --  141 141  K 4.6  --  3.3* 3.4*  CL 97*  --  106 101  CO2 23  --  27 25  GLUCOSE 176*  --  154* 168*  BUN 32*  --  31* 28*  CREATININE 2.14*  --  1.90* 2.11*  CALCIUM 10.0  --  9.9 10.1  MG  --  2.2  --  2.2  PHOS  --  2.9  --   --     CBC: Recent Labs  Lab 06/21/18 0525 06/23/18 0620  WBC 10.1 10.7*  NEUTROABS  --  7.9*  HGB 15.3 16.5  HCT 44.2 50.8  MCV 93.3 95.3  PLT 201 210    Imaging: Dg Chest 1 View  Result Date: 06/21/2018 . IMPRESSION: 1.  Mild LEFT basilar atelectasis with elevation of the LEFT hemidiaphragm. No acute cardiopulmonary disease otherwise. 2. Marked cardiomegaly. Mild pulmonary venous hypertension without evidence of overt pulmonary edema currently. Electronically Signed   By: Evangeline Dakin M.D.   On: 06/21/2018 13:45   Ct Head Wo Contrast  Result Date: 06/21/2018  IMPRESSION: 1. Motion degraded examination.  No acute intracranial process. 2. 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 3. 1 cm lytic lesion RIGHT frontal calvarium could reflect atypical hemangioma though, metastatic disease not excluded. Given presence of LEFT periauricular metallic foreign bodies, MRI may be nondiagnostic though, not contraindicated. Electronically Signed   Ct Cervical Spine Wo Contrast  Result Date: 06/22/2018 . IMPRESSION: No fracture or spondylolisthesis is noted in the cervical spine. Multilevel degenerative disc disease is noted. Mild to moderate right-sided neural foraminal stenosis is noted at C6-7 secondary to uncovertebral spurring. Electronically Signed   By: Marijo Conception, M.D.   On: 06/22/2018 17:24   Ct Angio Chest Aorta W/cm &/or Wo/cm  Result Date: 06/22/2018  IMPRESSION: 1. No evidence of thoracic aortic aneurysm/dissection or definite pulmonary emboli. No hematoma. 2. Bilateral LOWER lobe opacities which may represent airspace disease/pneumonia or atelectasis. Small LEFT pleural effusion. 3. Indeterminate enhancing splenic lesions. Dedicated CT or MRI of the abdomen/pelvis recommended. 4. Marked cardiomegaly. 5. Coronary artery and Aortic Atherosclerosis (ICD10-I70.0).   Assessment: 77 year old male with PMH a. Fib ( on eliquis), PPM, HTN, CVA (right PCA), HLD, CHF ( EF 35%). End stage liver disease CKD-3 and  multiple falls who presented to [facility] s/p fall and AMS on 06/21/18. Transferred to [facility] for MRI with PPM. MRI C-spine to evaluate for chord compression.    Recommendations: - MRI brain -MRI C-spine -  consider decreasing Ambien to improve waxing and waning of mental status.  Laurey Morale, MSN, NP-C [facility] [phone]  Attending neurologist's note to follow I have seen the patient and reviewed the above note.  Unfortunately, for MRI, he got doses of multiple sedating medications.  He is therefore very sedated at this time.  Earlier today, he reportedly was thinking much more clearly than he  had been but still had bilateral upper extremity greater than lower extremity weakness.  On my exam, he does not move his upper extremities much at all, does withdrawal to noxious stimulation in his lower extremities.  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  He will need neurosurgical evaluation for that.    Further recommendations for the cervical stenosis per neurosurgery, no further recommendations at this time, please call with further questions or concerns.  Roland Rack, MD [facility] [phone]  If 7pm- 7am, please page neurology on call as listed in [place].  06/23/2018, 8:34 AM

## 2018-06-23 NOTE — H&P (Addendum)
History and Physical    Leonard Welch ZOX:[id number] DOB: April 06, 1941 DOA: 06/23/2018  Referring MD/NP/PA:   PCP: Barnett Hatter, MD   Patient coming from:  The patient is coming from home.  At baseline, pt is dependent for most of ADL.  Chief Complaint: Bilateral upper extremity weakness  HPI: Leonard Welch is a 77 y.o. 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  Pt is accepted by Dr. Allena Katz.   Pt was initially admitted to [facility] hospital on 06/21/18 due to altered mental status. Pt has been treated for suspected hepatic encephalopathy with lactulose.  Mental status has improved, but was found to have new onset letter upper extremity weakness. Neurology recommended CT head, cervical spine, and angiogram of chest which were unrevealing. MRI of cervical spine recommended to assess for cord contusion, however unable to perform at [place] due to [facility]. [facility] discussed with [facility] Neurologist Dr. Amada Jupiter, who agreed to consult if transferred to [facility] cone for MRI capabilities. Per accepting physician, Dr. Allena Katz, they also consulted [facility], the transfer was made depending on first availability.  When I saw on the floor, pt is confused, knows his own name, oriented to place, but not to time and other persons. He cannot move both upper extremities, has weakness on both legs.  He has multiple skin abrasions in both arms, forehead, face, nose bridge, multiple skin ulcers lesions in both legs.  No active respiratory distress, cough, nausea, vomiting, diarrhea notated.  Patient denies chest pain.  He has distended abdomen and abdominal tenderness on palpation.  Review of Systems: Could not reviewed accurately due to altered mental  status.  Allergy:  Allergies  Allergen Reactions  . Amiodarone   . Warfarin And Related     Past Medical History:  Diagnosis Date  . Cardiac pacemaker   . CKD (chronic kidney disease)   . Diabetes mellitus without complication (HCC)   . Encephalopathy   . Gout   . Heart disease     Past Surgical History:  Procedure Laterality Date  . APPENDECTOMY    . CARDIOVERSION    . PACEMAKER PLACEMENT    . TONSILLECTOMY    . TOTAL HIP ARTHROPLASTY Right     Social History:  reports that he has never smoked. He has never used smokeless tobacco. He reports that he drinks alcohol. He reports that he does not use drugs.  Family History: Could not be reviewed accurately due to altered mental status.  Prior to Admission medications   Medication Sig Start Date End Date Taking? Authorizing Provider  acetaminophen (TYLENOL) 325 MG tablet Take 650 mg by mouth every 4 (four) hours as needed.    [provider]  allopurinol (ZYLOPRIM) 300 MG tablet Take 300 mg by mouth daily.    [provider]  bumetanide (BUMEX) 2 MG tablet Take 2 mg by mouth daily.    [provider]  docusate sodium (COLACE) 100 MG capsule Take 100 mg by mouth 2 (two) times daily.    [provider]  glucosamine-chondroitin 500-400 MG tablet Take 1 tablet by mouth 2 (two) times daily.    [provider]  insulin aspart (NOVOLOG) 100 UNIT/ML injection Inject 10 Units into the skin 3 (three) times daily with meals.    [provider]  insulin  glargine (LANTUS) 100 UNIT/ML injection Inject 18 Units into the skin at bedtime.    [provider]  lactulose (CHRONULAC) 10 GM/15ML solution Take 45 mLs (30 g total) by mouth 2 (two) times daily. 06/22/18   Altamese Dilling, MD  levothyroxine (SYNTHROID, LEVOTHROID) 175 MCG tablet Take 175 mcg by mouth daily before breakfast.    [provider]  Melatonin 5 MG TABS Take 1 tablet by mouth at bedtime as needed.     [provider]  metoprolol tartrate (LOPRESSOR) 25 MG tablet Take 25 mg by mouth 2 (two) times daily.    [provider]  Multiple Vitamins-Minerals (CERTAVITE SENIOR/ANTIOXIDANT) TABS Take 1 tablet by mouth daily.    [provider]  nystatin (NYSTATIN) powder Apply 1 g topically 2 (two) times daily.    [provider]  polyethylene glycol (MIRALAX / GLYCOLAX) packet Take 17 g by mouth daily.    [provider]  pravastatin (PRAVACHOL) 10 MG tablet Take 10 mg by mouth at bedtime.    [provider]  senna (SENOKOT) 8.6 MG tablet Take 1 tablet by mouth 2 (two) times daily.    [provider]  spironolactone (ALDACTONE) 25 MG tablet Take 25 mg by mouth daily.    [provider]  sulfamethoxazole-trimethoprim (BACTRIM DS,SEPTRA DS) 800-160 MG tablet Take 1 tablet by mouth 2 (two) times daily.    [provider]    Physical Exam: Vitals:   06/23/18 0319  BP: (!) 122/91  Pulse: 99  Resp: 19  Temp: (!) 97.5 F (36.4 C)  TempSrc: Oral  SpO2: 98%  Height: 6' (1.829 m)   General: Not in acute distress HEENT:       Eyes: PERRL, EOMI, no scleral icterus.       ENT: No discharge from the ears and nose, no pharynx injection, no tonsillar enlargement.        Neck: No JVD, no bruit, no mass felt. Heme: No neck lymph node enlargement. Cardiac: S1/S2, RRR, No murmurs, No gallops or rubs. Respiratory: No rales, wheezing, rhonchi or rubs. GI: has distention and diffused mild tenderness, no rebound pain, no organomegaly, BS present. GU: No hematuria Ext: has has trace leg edema bilaterally. 2+DP/PT pulse bilaterally. Musculoskeletal: No joint deformities, No joint redness or warmth, no limitation of ROM in spin. Skin: No rashes.  Neuro:  pt is confused, knows his own name, oriented to place, but not to time and other persons. partially following commands.  cranial nerves II-XII grossly intact. He cannot move both  extremities at all, has bilateral leg weakness.  Psych: Patient is not psychotic.  Labs on Admission: I have personally reviewed following labs and imaging studies  CBC: Recent Labs  Lab 06/21/18 0525  WBC 10.1  HGB 15.3  HCT 44.2  MCV 93.3  PLT 201   Basic Metabolic Panel: Recent Labs  Lab 06/21/18 0525 06/21/18 0727 06/22/18 0559  NA 134*  --  141  K 4.6  --  3.3*  CL 97*  --  106  CO2 23  --  27  GLUCOSE 176*  --  154*  BUN 32*  --  31*  CREATININE 2.14*  --  1.90*  CALCIUM 10.0  --  9.9  MG  --  2.2  --   PHOS  --  2.9  --    GFR: Estimated Creatinine Clearance: 40.9 mL/min (A) (by C-G formula based on SCr of 1.9 mg/dL (H)). Liver Function Tests: Recent Labs  Lab 06/21/18 0525  AST 45*  ALT 21  ALKPHOS 224*  BILITOT 2.2*  PROT 7.9  ALBUMIN 3.6   No results for input(s): LIPASE, AMYLASE in the last 168 hours. Recent Labs  Lab 06/21/18 0644 06/22/18 0835  AMMONIA 36* 11   Coagulation Profile: Recent Labs  Lab 06/21/18 0727  INR 1.33   Cardiac Enzymes: No results for input(s): CKTOTAL, CKMB, CKMBINDEX, TROPONINI in the last 168 hours. BNP (last 3 results) No results for input(s): PROBNP in the last 8760 hours. HbA1C: No results for input(s): HGBA1C in the last 72 hours. CBG: Recent Labs  Lab 06/22/18 0920 06/22/18 1219 06/22/18 1758 06/22/18 2053 06/23/18 0407  GLUCAP 114* 168* 157* 129* 170*   Lipid Profile: No results for input(s): CHOL, HDL, LDLCALC, TRIG, CHOLHDL, LDLDIRECT in the last 72 hours. Thyroid Function Tests: Recent Labs    06/21/18 1336  TSH 1.108   Anemia Panel: Recent Labs    06/21/18 1336  VITAMINB12 528  FOLATE 10.3   Urine analysis:    Component Value Date/Time   COLORURINE YELLOW (A) 06/21/2018 1630   APPEARANCEUR TURBID (A) 06/21/2018 1630   LABSPEC 1.013 06/21/2018 1630   PHURINE 5.0 06/21/2018 1630   GLUCOSEU NEGATIVE 06/21/2018 1630   HGBUR MODERATE (A) 06/21/2018 1630   BILIRUBINUR NEGATIVE  06/21/2018 1630   KETONESUR NEGATIVE 06/21/2018 1630   PROTEINUR NEGATIVE 06/21/2018 1630   NITRITE NEGATIVE 06/21/2018 1630   LEUKOCYTESUR TRACE (A) 06/21/2018 1630   Sepsis Labs: @LABRCNTIP (procalcitonin:4,lacticidven:4) ) Recent Results (from the past 240 hour(s))  MRSA PCR Screening     Status: Abnormal   Collection Time: 06/21/18 10:21 AM  Result Value Ref Range Status   MRSA by PCR POSITIVE (A) NEGATIVE Final    Comment:        The GeneXpert MRSA Assay (FDA approved for NASAL specimens only), is one component of a comprehensive MRSA colonization surveillance program. It is not intended to diagnose MRSA infection nor to guide or monitor treatment for MRSA infections. RESULT CALLED TO, READ BACK BY AND VERIFIED WITH: BERENICE AJEJO AT 1154 ON 06/21/2018, KP Performed at [facility], [address] [place], [place] [address]      Radiological Exams on Admission: Dg Chest 1 View  Result Date: 06/21/2018 CLINICAL DATA:  Acute mental status changes with confusion. Indwelling cardiac pacemaker. Current history of chronic kidney disease and diabetes. EXAM: Portable CHEST 1 VIEW COMPARISON:  None. FINDINGS: Cardiac silhouette markedly enlarged even allowing for AP portable technique. Thoracic aorta atherosclerotic. Hilar and mediastinal contours otherwise unremarkable. LEFT subclavian single lead transvenous pacemaker intact. Mild pulmonary venous hypertension without overt edema. Elevation of the LEFT hemidiaphragm with mild atelectasis at the LEFT lung base. Lungs otherwise clear. No visible pleural effusions. IMPRESSION: 1. Mild LEFT basilar atelectasis with elevation of the LEFT hemidiaphragm. No acute cardiopulmonary disease otherwise. 2. Marked cardiomegaly. Mild pulmonary venous hypertension without evidence of overt pulmonary edema currently. Electronically Signed   By: Hulan Saas M.D.   On: 06/21/2018 13:45   Ct Head Wo Contrast  Result Date:  06/21/2018 CLINICAL DATA:  Fall, forehead laceration. History of encephalopathy. EXAM: CT HEAD WITHOUT CONTRAST TECHNIQUE: Contiguous axial images were obtained from the base of the skull through the vertex without intravenous contrast. COMPARISON:  CT HEAD June 21, 2018 at 0456 hours FINDINGS: Motion degraded examination. BRAIN: No intraparenchymal hemorrhage, mass effect, midline shift or acute large vascular territory infarcts. Mesial RIGHT temporal occipital lobe encephalomalacia with mild ex vacuo dilatation  RIGHT ventricle atrium. No hydrocephalus. Patchy supratentorial white matter hypodensities. No abnormal extra-axial fluid collections. VASCULAR: Moderate calcific atherosclerosis of the carotid siphons. SKULL: No skull fracture. 1 cm atypical lytic lesion RIGHT frontal calvarium (axial image 42). No significant scalp soft tissue swelling. Metallic foreign bodies LEFT periauricular soft tissues. SINUSES/ORBITS: Trace paranasal sinus mucosal thickening. Mastoid air cells are well aerated.The included ocular globes and orbital contents are non-suspicious. OTHER: None. IMPRESSION: 1. Motion degraded examination.  No acute intracranial process. 2. 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 3. 1 cm lytic lesion RIGHT frontal calvarium could reflect atypical hemangioma though, metastatic disease not excluded. Given presence of LEFT periauricular metallic foreign bodies, MRI may be nondiagnostic though, not contraindicated. Electronically Signed   By: Awilda Metro M.D.   On: 06/21/2018 18:12   Ct Cervical Spine Wo Contrast  Result Date: 06/22/2018 CLINICAL DATA:  Bilateral upper extremity weakness after fall. EXAM: CT CERVICAL SPINE WITHOUT CONTRAST TECHNIQUE: Multidetector CT imaging of the cervical spine was performed without intravenous contrast. Multiplanar CT image reconstructions were also generated. COMPARISON:  CT scan of June 21, 2018. FINDINGS: Alignment: Normal. Skull base and  vertebrae: No acute fracture. No primary bone lesion or focal pathologic process. Soft tissues and spinal canal: No prevertebral fluid or swelling. No visible canal hematoma. Disc levels: Moderate anterior osteophyte formation is noted at C4-5 and C5-6. Moderate degenerative disc disease is noted at C6-7 and C7-T1. Mild to moderate right-sided neural foraminal stenosis is noted at C6-7 secondary to uncovertebral spurring. Upper chest: Negative. Other: None. IMPRESSION: No fracture or spondylolisthesis is noted in the cervical spine. Multilevel degenerative disc disease is noted. Mild to moderate right-sided neural foraminal stenosis is noted at C6-7 secondary to uncovertebral spurring. Electronically Signed   By: Lupita Raider, M.D.   On: 06/22/2018 17:24   Ct Angio Chest Aorta W/cm &/or Wo/cm  Result Date: 06/22/2018 CLINICAL DATA:  77 year old male with weakness and fall. EXAM: CT ANGIOGRAPHY CHEST WITH CONTRAST TECHNIQUE: Multidetector CT imaging of the chest was performed using the standard protocol during bolus administration of intravenous contrast. Multiplanar CT image reconstructions and MIPs were obtained to evaluate the vascular anatomy. CONTRAST:  75mL OMNIPAQUE IOHEXOL 350 MG/ML SOLN COMPARISON:  None. FINDINGS: Cardiovascular: This is a technically adequate study for evaluation of aortic aneurysm/dissection. This is technically borderline for evaluation of pulmonary emboli. Marked cardiomegaly identified. Coronary artery and aortic atherosclerotic calcifications noted without thoracic aortic aneurysm. Pacemaker identified. No pericardial effusion. No definite pulmonary emboli identified. Mediastinum/Nodes: No enlarged mediastinal, hilar, or axillary lymph nodes. Thyroid gland, trachea, and esophagus demonstrate no significant findings. Lungs/Pleura: A small LEFT pleural effusion is noted. Bilateral LOWER lobe airspace disease/consolidation/atelectasis noted. Multiple calcified granulomas within  the lungs noted. No evidence of pneumothorax. Upper Abdomen: Numerous heterogeneous enhancing round lesions within the spleen identified, the largest measuring 3 cm (series 5: Image 74). Musculoskeletal: No acute or suspicious bony abnormalities noted. Review of the MIP images confirms the above findings. IMPRESSION: 1. No evidence of thoracic aortic aneurysm/dissection or definite pulmonary emboli. No hematoma. 2. Bilateral LOWER lobe opacities which may represent airspace disease/pneumonia or atelectasis. Small LEFT pleural effusion. 3. Indeterminate enhancing splenic lesions. Dedicated CT or MRI of the abdomen/pelvis recommended. 4. Marked cardiomegaly. 5. Coronary artery and Aortic Atherosclerosis (ICD10-I70.0). Electronically Signed   By: Harmon Pier M.D.   On: 06/22/2018 17:28     EKG on 06/21/18: Independently reviewed.  Atrial fibrillation, QTc 509, RAD.  Assessment/Plan Principal Problem:   Upper extremity  weakness Active Problems:   Hepatic encephalopathy (HCC)   Gout   HLD (hyperlipidemia)   Type II diabetes mellitus with renal manifestations (HCC)   Stroke (cerebrum) (HCC)   Hypothyroidism   Acute renal failure superimposed on stage 3 chronic kidney disease (HCC)   Chronic combined systolic (congestive) and diastolic (congestive) heart failure (HCC)   Atrial fibrillation, chronic   Upper extremity weakness: Etiology is not clear. CT head, cervical spine, and angiogram of chest were unrevealing. MRI of cervical spine recommended to assess for cord contusion, however unable to perform at [place] due to PPM.  Neurology, Dr. Amada Jupiter was consulted. RN will inform on-call neurologist of pt's arrival.  -will admit to tele bed as inpt -f/u neurology recommendation - Frequent neuro check - I ordered MRI-C spin without contrast. I tried to contact MRI department, but no one picked up the phone. I put note in my order, saying that pt has PPM and also CT-head showed LEFT periauricular  metallic foreign bodies. Pt had MRI-brain in [facility] last month. I also talked to RN, Christen Bame and told him that when MRI personnel comes for picking up pt, they need to let them know that patient has pacemaker.  Hepatic encephalopathy and end stage of Liver disease: per report, pt's mental status has improved with lactulose treatment.  Ammonia level decreased from 36 to 11.  INR 1.33, PTT 41. Pt has diffused abdominal distention, and abdominal tenderness.  Patient is on Bactrim not sure why patient is on this antibiotics, possibly for SBP prophylaxis. In addition, pt had an abnormal urinalysis with trace amount of leukocyte, rare bacteria, turbid appearance and WBC 10-20, indicating possible UTI. Due to worsening renal function, I will switch to Rocephin. -Frequent neuro check -continue lactulose 30 g 2 times daily - rocephin IV - f/u Bx and Ux  Gout: -continue home allopurinol  HLD: -Pravastatin  Type II diabetes mellitus with renal manifestations (HCC): Last A1c not on record. Patient is taking glargine insulin at home -will decrease glargine insulin dose from 18 to 9 units daily -SSI -Check A1c  Atrial Fibrillation: CHA2DS2-VASc Score is 7, needs oral anticoagulation. Patient was on Eliquis which is on hold now. Heart rate is well controlled. -Continue metoprolol -Eliquis is hold today as per progress note in [facility], "as we do not know if he had bleed or any abnormalities in his vasculature causing this new weakness".  Hx of Stroke (cerebrum) (HCC): -on pravastatin -Eliquis is on hold  Hypothyroidism: Last TSH was 1.108 on 06/21/18 -Continue home Synthroid  AoCKD-III: Baseline Cre is 1.5, pt's Cre was 2.14 on 06/21/18-->1.90. Likely due to possible UTI and dehydration and continuation of diuretics. - Follow up renal function by BMP - Hold torsemide and spironolactone -DC Bactrim  Chronic combined systolic (congestive) and diastolic (congestive) heart failure (HCC):  2D echo 05/20/2018 showed EF 35%.  Patient has trace leg edema, but no JVD, no acute respiratory distress.  CHF seems to be compensated. -Hold diuretics due to worsening renal function -check BNP  Multiple skin lesions, skin abrasion and tear: -Wound care consult.  Abnormal finding on CT-head 06/21/18: CT-head showed a 1 cm lytic lesion RIGHT frontal calvarium. This could reflect atypical hemangioma per radiologist, though metastatic disease not excluded. Of note, pt was found to have LEFT periauricular metallic foreign bodies on CT-scan, MRI may be nondiagnostic though not contraindicated per radiologist. -f/u to repeat CT-head as outpt with PCP   Inpatient status:  # Patient requires inpatient status due  to high intensity of service, high risk for further deterioration and high frequency of surveillance required.  I certify that at the point of admission it is my clinical judgment that the patient will require inpatient hospital care spanning beyond 2 midnights from the point of admission.  . 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 . Now patient has presenting symptoms include bilateral upper extremity weakness and AMS, also has worsening renal function, multiple skin lesions. . The worrisome physical exam findings include altered mental status, abdominal tenderness, abdominal distention, multiple skin lesions. . The initial radiographic and laboratory data are worrisome because of Worsening renal function, abnormal urinalysis . Current medical needs: please see my assessment and plan     DVT ppx: on sq heparin Code Status: DNR (pt is wearing DNR ring in his wrist) Family Communication: None at bed side.     Disposition Plan:  Anticipate discharge back to previous home environment Consults called:  Neuro. Dr.  Amada Jupiter Admission status:  Inpatient/tele     Date of Service 06/23/2018    Lorretta Harp [facility] Pager [phone]  If 7PM-7AM, please contact night-coverage [email or web address] Password [email or web address] 06/23/2018, 5:46 AM

## 2018-06-23 NOTE — Progress Notes (Signed)
[name] at bedside-  State that she wants patient to transfer to [facility].    Is waiting for a bed.   Does not want to do any procedure here.  Pt is just waiting for a bed to become available.

## 2018-06-23 NOTE — Progress Notes (Signed)
  Vital Signs MEWS/VS Documentation      06/23/2018 1243 06/23/2018 1639 06/23/2018 1657 06/23/2018 2007   MEWS Score:  1  2  1  1    MEWS Score Color:  Green  Yellow  Green  Green   Resp:  (!) 22  -  (!) 24  18   Pulse:  94  (!) 104  100  (!) 107   BP:  (!) 123/93  (!) 116/93  111/78  115/78   Temp:  97.9 F (36.6 C)  -  -  98 F (36.7 C)   O2 Device:  Nasal Cannula  Nasal Cannula  Nasal Cannula  Nasal Cannula   O2 Flow Rate (L/min):  1 L/min  1 L/min  1 L/min  1 L/min        Patient has Afib.  asymptomatic   Jisella Ashenfelter L Tannah Dreyfuss 06/23/2018,8:13 PM

## 2018-06-23 NOTE — Progress Notes (Addendum)
[facility] PROGRESS NOTE  Burrel Legrand QMV:[id number] DOB: 07-31-1941 DOA: 06/23/2018  PCP: Barnett Hatter, MD  Brief History/Interval Summary: 77 y.o. 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 Pt was initially admitted to [facility] hospital on 06/21/18 due to altered mental status. Pt has been treated for suspected hepatic encephalopathy with lactulose.  Mental status has improved, but was found to have new onset letter upper extremity weakness. Neurology recommended CT head, cervical spine, and angiogram of chest which were unrevealing. MRI of cervical spine recommended to assess for cord contusion, however unable to perform at [place] due to PPM.   Reason for Visit: Bilateral upper extremity weakness  Consultants: Neurology  Procedures: None yet  Antibiotics: Ceftriaxone  Subjective/Interval History: Patient distracted.  Slow to respond.  However he denies any pain.  He does admit to weakness in both his arms.  Denies any weakness in his legs.  ROS: Denies any chest pain or shortness of breath.  Objective:  Vital Signs  Vitals:   06/23/18 0319 06/23/18 0950  BP: (!) 122/91 133/85  Pulse: 99 (!) 105  Resp: 19   Temp: (!) 97.5 F (36.4 C)   TempSrc: Oral   SpO2: 98% 98%  Height: 6' (1.829 m)     Intake/Output Summary (Last 24 hours) at 06/23/2018 1028 Last data filed at 06/23/2018 0900 Gross per 24 hour  Intake 240 ml  Output -  Net 240 ml   There were no vitals filed for this visit.  General appearance: alert, distracted and no distress Head: Normocephalic, without obvious abnormality, atraumatic Resp: Normal effort at rest.  Diminished air entry at the bases.  No definite wheezing rales or rhonchi. Cardio:  regular rate and rhythm, S1, S2 normal, no murmur, click, rub or gallop GI: Abdomen noted to be mildly distended.  Nontender.  No masses organomegaly Extremities: Minimal lower extremity edema is present.  Lower legs are covered in dressing. Pulses: 2+ and symmetric Skin: Multiple skin tears and bruising noted all over his body. Neurologic: Alert.  Distracted disoriented.  No obvious cranial nerve deficits.  Does have decreased motor strength in both his upper extremities and elbow wrist.  Able to lift both legs off the bed.  Lab Results:  Data Reviewed: I have personally reviewed following labs and imaging studies  CBC: Recent Labs  Lab 06/21/18 0525 06/23/18 0620  WBC 10.1 10.7*  NEUTROABS  --  7.9*  HGB 15.3 16.5  HCT 44.2 50.8  MCV 93.3 95.3  PLT 201 210    Basic Metabolic Panel: Recent Labs  Lab 06/21/18 0525 06/21/18 0727 06/22/18 0559 06/23/18 0620  NA 134*  --  141 141  K 4.6  --  3.3* 3.4*  CL 97*  --  106 101  CO2 23  --  27 25  GLUCOSE 176*  --  154* 168*  BUN 32*  --  31* 28*  CREATININE 2.14*  --  1.90* 2.11*  CALCIUM 10.0  --  9.9 10.1  MG  --  2.2  --  2.2  PHOS  --  2.9  --   --     GFR: Estimated Creatinine Clearance: 36.8 mL/min (A) (by C-G formula based on SCr of 2.11 mg/dL (H)).  Liver Function Tests: Recent Labs  Lab 06/21/18 0525 06/23/18  0620  AST 45* 27  ALT 21 18  ALKPHOS 224* 194*  BILITOT 2.2* 1.5*  PROT 7.9 7.6  ALBUMIN 3.6 3.1*     Recent Labs  Lab 06/21/18 0644 06/22/18 0835  AMMONIA 36* 11    Coagulation Profile: Recent Labs  Lab 06/21/18 0727  INR 1.33    CBG: Recent Labs  Lab 06/22/18 1219 06/22/18 1758 06/22/18 2053 06/23/18 0407 06/23/18 0802  GLUCAP 168* 157* 129* 170* 155*    Thyroid Function Tests: Recent Labs    06/21/18 1336  TSH 1.108    Anemia Panel: Recent Labs    06/21/18 1336  VITAMINB12 528  FOLATE 10.3    Recent Results (from the past 240 hour(s))  MRSA PCR Screening      Status: Abnormal   Collection Time: 06/21/18 10:21 AM  Result Value Ref Range Status   MRSA by PCR POSITIVE (A) NEGATIVE Final    Comment:        The GeneXpert MRSA Assay (FDA approved for NASAL specimens only), is one component of a comprehensive MRSA colonization surveillance program. It is not intended to diagnose MRSA infection nor to guide or monitor treatment for MRSA infections. RESULT CALLED TO, READ BACK BY AND VERIFIED WITH: BERENICE AJEJO AT 1154 ON 06/21/2018, KP Performed at [facility], [address] [place], [place] [address]   Urine culture     Status: Abnormal   Collection Time: 06/21/18  4:30 PM  Result Value Ref Range Status   Specimen Description   Final    URINE, RANDOM Performed at [facility], [address], [place], [place] [address]    Special Requests   Final    NONE Performed at [facility], [address], [place], [place] [address]    Culture (A)  Final    <10,000 COLONIES/mL INSIGNIFICANT GROWTH Performed at [facility], [address], [place], [place] [address]    Report Status 06/23/2018 FINAL  Final      Radiology Studies: Dg Chest 1 View  Result Date: 06/21/2018 CLINICAL DATA:  Acute mental status changes with confusion. Indwelling cardiac pacemaker. Current history of chronic kidney disease and diabetes. EXAM: Portable CHEST 1 VIEW COMPARISON:  None. FINDINGS: Cardiac silhouette markedly enlarged even allowing for AP portable technique. Thoracic aorta atherosclerotic. Hilar and mediastinal contours otherwise unremarkable. LEFT subclavian single lead transvenous pacemaker intact. Mild pulmonary venous hypertension without overt edema. Elevation of the LEFT hemidiaphragm with mild atelectasis at the LEFT lung base. Lungs otherwise clear. No visible pleural effusions. IMPRESSION: 1. Mild LEFT basilar atelectasis with elevation of the LEFT hemidiaphragm. No acute cardiopulmonary disease otherwise. 2.  Marked cardiomegaly. Mild pulmonary venous hypertension without evidence of overt pulmonary edema currently. Electronically Signed   By: Hulan Saas M.D.   On: 06/21/2018 13:45   Ct Head Wo Contrast  Result Date: 06/21/2018 CLINICAL DATA:  Fall, forehead laceration. History of encephalopathy. EXAM: CT HEAD WITHOUT CONTRAST TECHNIQUE: Contiguous axial images were obtained from the base of the skull through the vertex without intravenous contrast. COMPARISON:  CT HEAD June 21, 2018 at 0456 hours FINDINGS: Motion degraded examination. BRAIN: No intraparenchymal hemorrhage, mass effect, midline shift or acute large vascular territory infarcts. Mesial RIGHT temporal occipital lobe encephalomalacia with mild ex vacuo dilatation RIGHT ventricle atrium. No hydrocephalus. Patchy supratentorial white matter hypodensities. No abnormal extra-axial fluid collections. VASCULAR: Moderate calcific atherosclerosis of the carotid siphons. SKULL: No skull fracture. 1 cm atypical lytic lesion RIGHT frontal calvarium (  axial image 42). No significant scalp soft tissue swelling. Metallic foreign bodies LEFT periauricular soft tissues. SINUSES/ORBITS: Trace paranasal sinus mucosal thickening. Mastoid air cells are well aerated.The included ocular globes and orbital contents are non-suspicious. OTHER: None. IMPRESSION: 1. Motion degraded examination.  No acute intracranial process. 2. 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 3. 1 cm lytic lesion RIGHT frontal calvarium could reflect atypical hemangioma though, metastatic disease not excluded. Given presence of LEFT periauricular metallic foreign bodies, MRI may be nondiagnostic though, not contraindicated. Electronically Signed   By: Awilda Metro M.D.   On: 06/21/2018 18:12   Ct Cervical Spine Wo Contrast  Result Date: 06/22/2018 CLINICAL DATA:  Bilateral upper extremity weakness after fall. EXAM: CT CERVICAL SPINE WITHOUT CONTRAST TECHNIQUE: Multidetector CT  imaging of the cervical spine was performed without intravenous contrast. Multiplanar CT image reconstructions were also generated. COMPARISON:  CT scan of June 21, 2018. FINDINGS: Alignment: Normal. Skull base and vertebrae: No acute fracture. No primary bone lesion or focal pathologic process. Soft tissues and spinal canal: No prevertebral fluid or swelling. No visible canal hematoma. Disc levels: Moderate anterior osteophyte formation is noted at C4-5 and C5-6. Moderate degenerative disc disease is noted at C6-7 and C7-T1. Mild to moderate right-sided neural foraminal stenosis is noted at C6-7 secondary to uncovertebral spurring. Upper chest: Negative. Other: None. IMPRESSION: No fracture or spondylolisthesis is noted in the cervical spine. Multilevel degenerative disc disease is noted. Mild to moderate right-sided neural foraminal stenosis is noted at C6-7 secondary to uncovertebral spurring. Electronically Signed   By: Lupita Raider, M.D.   On: 06/22/2018 17:24   Ct Angio Chest Aorta W/cm &/or Wo/cm  Result Date: 06/22/2018 CLINICAL DATA:  77 year old male with weakness and fall. EXAM: CT ANGIOGRAPHY CHEST WITH CONTRAST TECHNIQUE: Multidetector CT imaging of the chest was performed using the standard protocol during bolus administration of intravenous contrast. Multiplanar CT image reconstructions and MIPs were obtained to evaluate the vascular anatomy. CONTRAST:  75mL OMNIPAQUE IOHEXOL 350 MG/ML SOLN COMPARISON:  None. FINDINGS: Cardiovascular: This is a technically adequate study for evaluation of aortic aneurysm/dissection. This is technically borderline for evaluation of pulmonary emboli. Marked cardiomegaly identified. Coronary artery and aortic atherosclerotic calcifications noted without thoracic aortic aneurysm. Pacemaker identified. No pericardial effusion. No definite pulmonary emboli identified. Mediastinum/Nodes: No enlarged mediastinal, hilar, or axillary lymph nodes. Thyroid gland,  trachea, and esophagus demonstrate no significant findings. Lungs/Pleura: A small LEFT pleural effusion is noted. Bilateral LOWER lobe airspace disease/consolidation/atelectasis noted. Multiple calcified granulomas within the lungs noted. No evidence of pneumothorax. Upper Abdomen: Numerous heterogeneous enhancing round lesions within the spleen identified, the largest measuring 3 cm (series 5: Image 74). Musculoskeletal: No acute or suspicious bony abnormalities noted. Review of the MIP images confirms the above findings. IMPRESSION: 1. No evidence of thoracic aortic aneurysm/dissection or definite pulmonary emboli. No hematoma. 2. Bilateral LOWER lobe opacities which may represent airspace disease/pneumonia or atelectasis. Small LEFT pleural effusion. 3. Indeterminate enhancing splenic lesions. Dedicated CT or MRI of the abdomen/pelvis recommended. 4. Marked cardiomegaly. 5. Coronary artery and Aortic Atherosclerosis (ICD10-I70.0). Electronically Signed   By: Harmon Pier M.D.   On: 06/22/2018 17:28     Medications:  Scheduled: . allopurinol  300 mg Oral Daily  . bacitracin   Topical BID  . heparin injection (subcutaneous)  5,000 Units Subcutaneous Q8H  . insulin aspart  0-9 Units Subcutaneous TID WC  . insulin glargine  9 Units Subcutaneous QHS  . lactulose  30 g Oral BID  .  levothyroxine  175 mcg Oral QAC breakfast  . metoprolol tartrate  25 mg Oral BID  . multivitamin with minerals  1 tablet Oral Daily  . nystatin  1 g Topical BID  . pravastatin  10 mg Oral QHS  . senna  1 tablet Oral BID   Continuous: . cefTRIAXone (ROCEPHIN)  IV 1 g (06/23/18 0543)   YQM:[id number] **OR** acetaminophen, docusate sodium, hydrALAZINE, Melatonin, ondansetron **OR** ondansetron (ZOFRAN) IV, polyethylene glycol, zolpidem  Assessment/Plan:    Bilateral upper extremity weakness Etiology unclear.  CT scan of the head and cervical spine done at [place] did not show anything acute.  MRI was recommended  but could not be done at that facility as the patient has a pacemaker.  He apparently had an MRI recently at [facility] so quite likely that the pacemaker is MRI compatible.  Neurology consult is pending.  Patient does have weakness in the upper extremities.  Will eventually need PT and OT evaluation.  History of end-stage liver disease and encephalopathy Patient's ammonia level was noted to be elevated.  He was confused.  He was placed on lactulose with improvement in his mental status.  Patient noted to be on Bactrim at the time of transfer.  Reason for this is not entirely clear but thought to be secondary to SBP prophylaxis.  This has been discontinued.  He was switched over to Rocephin.  Mental status has improved.  Though he still remains distracted.  Abnormal appearing urine Moderate hemoglobin trace leukocytes 11-20 RBC 11-20 WBC noted.  Urine culture did not show any significant growth.  Unlikely the patient has clinically relevant urinary tract infection.  Diabetes mellitus type II Noted to be on glargine insulin.  Monitor CBGs.  SSI.  Acute on chronic kidney disease stage III/Hypokalemia Baseline creatinine appears to be around 1.5.  Creatinine has been greater than 2.  Could be due to dehydration.  Patient was also on diuretics which has been held.  Bactrim was discontinued.  Monitor urine output.  Follow creatinine trend.  Replace potassium.  Atrial fibrillation Patient was on Eliquis.  Patient is on metoprolol.  Eliquis is on hold until we can figure out why he has bilateral upper extremity weakness.  His chads 2 vascular score is 7.  Previous history of stroke Stable.  Noted to be on statin.  Has been on anticoagulation which is on hold.  Hypothyroidism Continue home medications.  TSH was 1.1 on 10/2.  History of gout Continue allopurinol.  Chronic combined systolic and diastolic CHF Echocardiogram from August 2019 showed a EF of 35%.  Patient was on diuretics which are on hold due  to worsening renal function.  His weight was recorded as 102.1 kg on 10/3.  Previous records reviewed.  His weight was 104.6 kg on 9/8.  Does not appear to be in overt volume overload.  Continue to monitor volume status closely.  BNP noted to be elevated at 971.3.  Multiple skin lesions abrasions and skin tears Wound care consult  Abnormal finding on CT-head 06/21/18 CT-head showed a 1 cm lytic lesion RIGHT frontal calvarium. This could reflect atypical hemangioma per radiologist, though metastatic disease not excluded. Of note, pt was found to have LEFT periauricular metallic foreign bodies on CT-scan, MRI may be nondiagnostic though not contraindicated per radiologist. -f/u to repeat CT-head as outpt with PCP  ADDENDUM MRI brain did not show any acute findings.  MRI cervical spine does show evidence for severe spinal stenosis at C3-4.  There is moderate  cord flattening and edema.  Discussed with Dr. Jordan Likes with neurosurgery who will consult on the patient.  Recommends dexamethasone to relieve the edema.  Will likely need surgical intervention though not emergently.  DVT Prophylaxis: Subcutaneous heparin    Code Status: DNR Family Communication: Discussed with the patient.  No family at bedside Disposition Plan: Await neurology input.  Await MRI.    LOS: 0 days   Osvaldo Shipper  [facility] Pager [phone] 06/23/2018, 10:28 AM  If 7PM-7AM, please contact night-coverage at [email or web address], password [facility]

## 2018-06-23 NOTE — Consult Note (Signed)
Reason for Consult: Myelopathy Referring Physician: [facility] hospitalist  Leonard Welch is an 77 y.o. male.  HPI: 77 year old L with multiple severe medical problems status post recent fall with worsening of weakness and evidence of severe myelopathy.  Past Medical History:  Diagnosis Date  . Cardiac pacemaker   . CKD (chronic kidney disease)   . Diabetes mellitus without complication ([place])   . Encephalopathy   . Gout   . Heart disease     Past Surgical History:  Procedure Laterality Date  . APPENDECTOMY    . CARDIOVERSION    . PACEMAKER PLACEMENT    . TONSILLECTOMY    . TOTAL HIP ARTHROPLASTY Right     No family history on file.  Social History:  reports that he has never smoked. He has never used smokeless tobacco. He reports that he drinks alcohol. He reports that he does not use drugs.  Allergies:  Allergies  Allergen Reactions  . Amiodarone   . Warfarin And Related     Medications: I have reviewed the patient's current medications.  Results for orders placed or performed during the hospital encounter of 06/23/18 (from the past 48 hour(s))  Glucose, capillary     Status: Abnormal   Collection Time: 06/23/18  4:07 AM  Result Value Ref Range   Glucose-Capillary 170 (H) 70 - 99 mg/dL  Brain natriuretic peptide     Status: Abnormal   Collection Time: 06/23/18  6:11 AM  Result Value Ref Range   B Natriuretic Peptide 971.3 (H) 0.0 - 100.0 pg/mL    Comment: Performed at [facility] [address], [place], [place] [address]  Comprehensive metabolic panel     Status: Abnormal   Collection Time: 06/23/18  6:20 AM  Result Value Ref Range   Sodium 141 135 - 145 mmol/L   Potassium 3.4 (L) 3.5 - 5.1 mmol/L   Chloride 101 98 - 111 mmol/L   CO2 25 22 - 32 mmol/L   Glucose, Bld 168 (H) 70 - 99 mg/dL   BUN 28 (H) 8 - 23 mg/dL   Creatinine, Ser 2.11 (H) 0.61 - 1.24 mg/dL   Calcium 10.1 8.9 - 10.3 mg/dL   Total Protein 7.6 6.5 - 8.1 g/dL   Albumin 3.1 (L) 3.5 - 5.0  g/dL   AST 27 15 - 41 U/L   ALT 18 0 - 44 U/L   Alkaline Phosphatase 194 (H) 38 - 126 U/L   Total Bilirubin 1.5 (H) 0.3 - 1.2 mg/dL   GFR calc non Af Amer 29 (L) >60 mL/min   GFR calc Af Amer 33 (L) >60 mL/min    Comment: (NOTE) The eGFR has been calculated using the CKD EPI equation. This calculation has not been validated in all clinical situations. eGFR's persistently <60 mL/min signify possible Chronic Kidney Disease.    Anion gap 15 5 - 15    Comment: Performed at [facility] [address] [place], [place] [address]  CBC with Differential/Platelet     Status: Abnormal   Collection Time: 06/23/18  6:20 AM  Result Value Ref Range   WBC 10.7 (H) 4.0 - 10.5 K/uL   RBC 5.33 4.22 - 5.81 MIL/uL   Hemoglobin 16.5 13.0 - 17.0 g/dL   HCT 50.8 39.0 - 52.0 %   MCV 95.3 78.0 - 100.0 fL   MCH 31.0 26.0 - 34.0 pg   MCHC 32.5 30.0 - 36.0 g/dL   RDW 16.5 (H) 11.5 - 15.5 %  Platelets 210 150 - 400 K/uL   Neutrophils Relative % 74 %   Neutro Abs 7.9 (H) 1.7 - 7.7 K/uL   Lymphocytes Relative 13 %   Lymphs Abs 1.4 0.7 - 4.0 K/uL   Monocytes Relative 10 %   Monocytes Absolute 1.1 (H) 0.1 - 1.0 K/uL   Eosinophils Relative 1 %   Eosinophils Absolute 0.1 0.0 - 0.7 K/uL   Basophils Relative 1 %   Basophils Absolute 0.1 0.0 - 0.1 K/uL   Immature Granulocytes 1 %   Abs Immature Granulocytes 0.1 0.0 - 0.1 K/uL    Comment: Performed at [facility] [address] [place], [place] [address]  Magnesium     Status: None   Collection Time: 06/23/18  6:20 AM  Result Value Ref Range   Magnesium 2.2 1.7 - 2.4 mg/dL    Comment: Performed at [facility] [address], [place], [place] [address]  Culture, blood (Routine X 2) w Reflex to ID Panel     Status: None (Preliminary result)   Collection Time: 06/23/18  6:20 AM  Result Value Ref Range   Specimen Description BLOOD LEFT ANTECUBITAL    Special Requests      BOTTLES DRAWN AEROBIC AND ANAEROBIC Blood Culture adequate  volume   Culture      NO GROWTH < 12 HOURS Performed at [facility], [place] [address] [place], [place] [address]    Report Status PENDING   Culture, blood (Routine X 2) w Reflex to ID Panel     Status: None (Preliminary result)   Collection Time: 06/23/18  6:27 AM  Result Value Ref Range   Specimen Description BLOOD LEFT ARM    Special Requests      BOTTLES DRAWN AEROBIC ONLY Blood Culture adequate volume   Culture      NO GROWTH < 12 HOURS Performed at [facility], [place] [address] [place], [place] [address]    Report Status PENDING   Glucose, capillary     Status: Abnormal   Collection Time: 06/23/18  8:02 AM  Result Value Ref Range   Glucose-Capillary 155 (H) 70 - 99 mg/dL  Glucose, capillary     Status: Abnormal   Collection Time: 06/23/18 11:57 AM  Result Value Ref Range   Glucose-Capillary 152 (H) 70 - 99 mg/dL  Glucose, capillary     Status: Abnormal   Collection Time: 06/23/18  4:49 PM  Result Value Ref Range   Glucose-Capillary 178 (H) 70 - 99 mg/dL    Ct Cervical Spine Wo Contrast  Result Date: 06/22/2018 CLINICAL DATA:  Bilateral upper extremity weakness after fall. EXAM: CT CERVICAL SPINE WITHOUT CONTRAST TECHNIQUE: Multidetector CT imaging of the cervical spine was performed without intravenous contrast. Multiplanar CT image reconstructions were also generated. COMPARISON:  CT scan of June 21, 2018. FINDINGS: Alignment: Normal. Skull base and vertebrae: No acute fracture. No primary bone lesion or focal pathologic process. Soft tissues and spinal canal: No prevertebral fluid or swelling. No visible canal hematoma. Disc levels: Moderate anterior osteophyte formation is noted at C4-5 and C5-6. Moderate degenerative disc disease is noted at C6-7 and C7-T1. Mild to moderate right-sided neural foraminal stenosis is noted at C6-7 secondary to uncovertebral spurring. Upper chest: Negative. Other: None. IMPRESSION: No fracture or spondylolisthesis is noted in the  cervical spine. Multilevel degenerative disc disease is noted. Mild to moderate right-sided neural foraminal stenosis is noted at C6-7 secondary to uncovertebral spurring. Electronically  Signed   By: Marijo Conception, M.D.   On: 06/22/2018 17:24   Mr Brain Wo Contrast  Result Date: 06/23/2018 CLINICAL DATA:  Stroke.  Bilateral arm weakness. EXAM: MRI HEAD WITHOUT CONTRAST TECHNIQUE: Multiplanar, multiecho pulse sequences of the brain and surrounding structures were obtained without intravenous contrast. COMPARISON:  CT head 06/21/2018 FINDINGS: Brain: MRI compatible pacemaker was monitored during the scan. No adverse effects. Moderate atrophy. Negative for acute infarct. Chronic hemorrhagic infarct right occipital lobe. Small chronic infarct right thalamus. Negative for mass or edema. Vascular: Normal arterial flow voids Skull and upper cervical spine: Negative Sinuses/Orbits: Bilateral cataract surgery.  Paranasal sinuses clear Other: None IMPRESSION: No acute abnormality Chronic hemorrhagic infarct right occipital lobe with generalized atrophy. Electronically Signed   By: Franchot Gallo M.D.   On: 06/23/2018 16:00   Mr Cervical Spine Wo Contrast  Result Date: 06/23/2018 CLINICAL DATA:  Recent fall. Bilateral upper extremity weakness. Myelopathy. EXAM: MRI CERVICAL SPINE WITHOUT CONTRAST TECHNIQUE: Multiplanar, multisequence MR imaging of the cervical spine was performed. No intravenous contrast was administered. COMPARISON:  Cervical spine CT 06/22/2018 FINDINGS: Multiple sequences are motion degraded with severe motion on the axial gradient echo sequence and milder motion on the axial spin echo sequence. Significant artifact is also present on the sagittal STIR and sagittal gradient echo sequences. Alignment: Trace retrolisthesis of C3 on C4. Vertebrae: No fracture, suspicious osseous lesion, or significant marrow edema. Cord: Possible low level T2/STIR hyperintensity in the cord at C3-4. No gross cord  hemorrhage. Posterior Fossa, vertebral arteries, paraspinal tissues: At most trace prevertebral fluid in the mid and lower cervical spine without other findings to clearly indicate ligamentous injury. At most minimal posterior soft tissue edema. Grossly preserved vertebral artery flow voids. Disc levels: Assessment of degenerative changes is limited by motion artifact on axial imaging. The cervical spinal canal is mildly small in caliber diffusely on a congenital basis. C2-3: Disc bulging, uncovertebral spurring, and mild facet arthrosis result in mild spinal stenosis and mild bilateral neural foraminal stenosis. C3-4: Disc bulging, broad posterior disc protrusion (partially calcified on CT), mild infolding of the ligamentum flavum, and mild facet arthrosis result in severe spinal stenosis with moderate cord flattening and moderate to severe right and severe left neural foraminal stenosis. C4-5: Disc bulging, uncovertebral spurring, and mild facet arthrosis result in moderate spinal stenosis and mild right and moderate left neural foraminal stenosis. C5-6: Mild disc bulging, small central disc protrusion, and minimal uncovertebral spurring result in mild spinal stenosis and mild bilateral neural foraminal stenosis. C6-7: Broad-based posterior disc osteophyte complex including a large right paracentral and foraminal component results in mild spinal stenosis with slight right ventral cord flattening and severe right and mild left neural foraminal stenosis. C7-T1: Negative. IMPRESSION: 1. Motion degraded examination without definite acute osseous abnormality or ligamentous injury identified. 2. 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 3. 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 4. Moderate spinal stenosis at C4-5 and milder spinal stenosis at the other levels. 5. Severe multilevel neural foraminal stenosis. Electronically Signed   By: Logan Bores  M.D.   On: 06/23/2018 16:13   Ct Angio Chest Aorta W/cm &/or Wo/cm  Result Date: 06/22/2018 CLINICAL DATA:  77 year old male with weakness and fall. EXAM: CT ANGIOGRAPHY CHEST WITH CONTRAST TECHNIQUE: Multidetector CT imaging of the chest was performed using the standard protocol during bolus administration of intravenous contrast. Multiplanar CT image reconstructions and MIPs were obtained to evaluate  the vascular anatomy. CONTRAST:  39m OMNIPAQUE IOHEXOL 350 MG/ML SOLN COMPARISON:  None. FINDINGS: Cardiovascular: This is a technically adequate study for evaluation of aortic aneurysm/dissection. This is technically borderline for evaluation of pulmonary emboli. Marked cardiomegaly identified. Coronary artery and aortic atherosclerotic calcifications noted without thoracic aortic aneurysm. Pacemaker identified. No pericardial effusion. No definite pulmonary emboli identified. Mediastinum/Nodes: No enlarged mediastinal, hilar, or axillary lymph nodes. Thyroid gland, trachea, and esophagus demonstrate no significant findings. Lungs/Pleura: A small LEFT pleural effusion is noted. Bilateral LOWER lobe airspace disease/consolidation/atelectasis noted. Multiple calcified granulomas within the lungs noted. No evidence of pneumothorax. Upper Abdomen: Numerous heterogeneous enhancing round lesions within the spleen identified, the largest measuring 3 cm (series 5: Image 74). Musculoskeletal: No acute or suspicious bony abnormalities noted. Review of the MIP images confirms the above findings. IMPRESSION: 1. No evidence of thoracic aortic aneurysm/dissection or definite pulmonary emboli. No hematoma. 2. Bilateral LOWER lobe opacities which may represent airspace disease/pneumonia or atelectasis. Small LEFT pleural effusion. 3. Indeterminate enhancing splenic lesions. Dedicated CT or MRI of the abdomen/pelvis recommended. 4. Marked cardiomegaly. 5. Coronary artery and Aortic Atherosclerosis (ICD10-I70.0). Electronically  Signed   By: JMargarette CanadaM.D.   On: 06/22/2018 17:28    Review of systems not obtained due to patient factors. Blood pressure 111/78, pulse 100, temperature 97.9 F (36.6 C), temperature source Oral, resp. rate (!) 24, height 6' (1.829 m), SpO2 98 %. Mental Status: Alert, oriented to person/year/month.event. thought content appropriate.  Speech without evidence of aphasia.  Voice tends to go in and out as he is talking. Able to follow some commands without difficulty. Unable to grip hands bilaterally, raise arms or wiggle fingers bilaterally. Cranial Nerves: II:  L homonymous hemianopsia III,IV, VI: ptosis not present, extra-ocular motions intact bilaterally pupils equal, round, reactive to light and accommodation V,VII: smile asymmetric, slight left facial droop. facial light touch sensation normal bilaterally VIII: hearing normal bilaterally IX,X: uvula rises symmetrically XI: bilateral shoulder shrug XII: midline tongue extension Motor: Right :  Upper extremity   1/5  Left:     Upper extremity   1/5             Lower extremity   4/5              Lower extremity   4/5 Tone and bulk:normal tone throughout; no atrophy noted Sensory: in BUE patient can feel painful stimulation above the elbow. 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  Plan treatment with IV steroids and observation.  If patient's medical situation allows we could consider surgical decompression sometime next week.  HMallie MusselA Avea Mcgowen 06/23/2018, 6:30 PM

## 2018-06-23 NOTE — Clinical Social Work Note (Signed)
[facility] CSW assessed patient and his wife yesterday. Patient was a resident at [facility]F for short time prior to admission. He recently discharged from [facility] and wife would like for him to return if possible. Please consult PT and OT when appropriate so they can evaluate appropriateness.   Charlynn Court, CSW [phone]

## 2018-06-23 NOTE — Evaluation (Signed)
Clinical/Bedside Swallow Evaluation Patient Details  Name: Leonard Welch MRN: [id number] Date of Birth: [birth year]  Today's Date: 06/23/2018 Time: SLP Start Time (ACUTE ONLY): 1208 SLP Stop Time (ACUTE ONLY): 1231 SLP Time Calculation (min) (ACUTE ONLY): 23 min  Past Medical History:  Past Medical History:  Diagnosis Date  . Cardiac pacemaker   . CKD (chronic kidney disease)   . Diabetes mellitus without complication ([facility])   . Encephalopathy   . Gout   . Heart disease    Past Surgical History:  Past Surgical History:  Procedure Laterality Date  . APPENDECTOMY    . CARDIOVERSION    . PACEMAKER PLACEMENT    . TONSILLECTOMY    . TOTAL HIP ARTHROPLASTY Right    HPI:  Pt is a 77 y.o. 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 He was initially admitted to [facility] hospital 10/2 with AMS. Pt was treated for suspected hepatic encephalopathy with lactulose.  Mental status improved, but pt developed BUE weakness. CT head, cervical spine, and angiogram of chest were unrevealing (although did note osteophytes C4-5 and C5-6). Pt was transferred to [facility] for further w/u including MRI of cervical spine. Pt was noted to be coughing with PO intake 10/4, therefore SLP was ordered.   Assessment / Plan / Recommendation Clinical Impression  Pt's alertness fluctuates during evaluation, and his bolus awareness is limited. He is completely dependent for self-feeding, which further impacts awareness. Attempted small amounts of thin liquids, but upon obtaining them orally, he starts to sputter, cough, and there is audible wetness. His cough seems weak, but as he continues to cough, he does expectorate what looks like water and/or secretions. Overall, his swallow appears disorganized. Pt refused trials of any other textures, also asking his wife to take things  that were not in the room and asking questions that did not make sense within given context. His wife shared that he had a similar episode of coughing earlier today when attempting thicker consistencies, but that yesterday morning, when he was more alert, he was able to have breakfast without overt difficulties. Pt appears to be at a high risk for aspiration at the moment - would recommending holding POs for now pending improvements in mentation. [name] did state that she would be open to temporary feeding tubes as w/u is still underway, but that they would not want any longer-term alternative means of nutrition. If he does not start to show improvements, she seemed to prefer pursuing comfort feeds. SLP will continue to follow to assess for PO readiness. SLP Visit Diagnosis: Dysphagia, unspecified (R13.10)    Aspiration Risk  Severe aspiration risk;Risk for inadequate nutrition/hydration    Diet Recommendation NPO   Medication Administration: Via alternative means    Other  Recommendations Oral Care Recommendations: Oral care QID   Follow up Recommendations (tba)      Frequency and Duration min 2x/week  2 weeks       Prognosis Prognosis for Safe Diet Advancement: Fair Barriers to Reach Goals: Severity of deficits      Swallow Study   General HPI: Pt is a 77 y.o. 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 He was initially admitted to [facility] hospital 10/2 with AMS. Pt was treated for suspected hepatic  encephalopathy with lactulose.  Mental status improved, but pt developed BUE weakness. CT head, cervical spine, and angiogram of chest were unrevealing (although did note osteophytes C4-5 and C5-6). Pt was transferred to [facility] for further w/u including MRI of cervical spine. Pt was noted to be coughing with PO intake 10/4, therefore SLP  was ordered. Type of Study: Bedside Swallow Evaluation Previous Swallow Assessment: none in chart Diet Prior to this Study: Regular;Thin liquids Temperature Spikes Noted: No Respiratory Status: Nasal cannula History of Recent Intubation: No Behavior/Cognition: Lethargic/Drowsy;Confused;Requires cueing Oral Cavity Assessment: Dry Oral Care Completed by SLP: No Oral Cavity - Dentition: Adequate natural dentition Self-Feeding Abilities: Total assist Patient Positioning: Upright in bed Baseline Vocal Quality: Normal;Other (comment)(in/out per wife over last few months) Volitional Cough: Weak Volitional Swallow: Able to elicit    Oral/Motor/Sensory Function Overall Oral Motor/Sensory Function: (cognitively difficult to follow commands)   Ice Chips Ice chips: Not tested   Thin Liquid Thin Liquid: Impaired Presentation: Cup;Spoon Oral Phase Impairments: Poor awareness of bolus;Reduced labial seal Oral Phase Functional Implications: Other (comment)(anterior spillage, expectoration) Pharyngeal  Phase Impairments: Suspected delayed Swallow;Cough - Immediate;Wet Vocal Quality    Nectar Thick Nectar Thick Liquid: Not tested   Honey Thick Honey Thick Liquid: Not tested   Puree Puree: Not tested   Solid     Solid: Not tested      Leonard Welch 06/23/2018,1:45 PM   Leonard Welch, M.A. CCC-SLP Acute Herbalist [phone] Office [phone]

## 2018-06-23 NOTE — Progress Notes (Signed)
Unable to complete nursing history pt only oriented to person. Admitted from [facility]/ . Pt DNR. 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 Telemetry on.

## 2018-06-23 NOTE — Consult Note (Signed)
WOC Nurse wound consult note Reason for Consult:BLE chronic wounds, multiple abrasions on forehead, one on nose.Bilateral arms have multiple scabs, questionably from pt picking.  Wound type:trauma Pressure Injury POA: NA Measurement:right lower pretibial intact blister 2cm x 2cm, right pretibial chronic wound 3.5cm x 3.5cm x 0.1cm moist pink wound bed, scant drainage. Forehead has 3cm x 3cm x 0.1cm and 2cm x 6cm x 0.1cm abrasions with pink moist wound beds. Left check bone with 3cm x 3cm x 0.1cm abrasion, dried wound bed.  Nose has 2cm x 1.5cm wound with dried serum scab. Left great toe has 0.3cm round dried serum scab on tip. Right second toe 3cm x 2cm dried serum scab.bilateral upper extremities has multiple "picking" wounds with dried serum scabs.  Wound bed:see above Drainage (amount, consistency, odor) see above Periwound:bruised but intact Dressing procedure/placement/frequency:I have provided nurses with orders for vaseline gauze to right pretibial wound, foam to intact blister on right pretibial. Bacitracin to abrasions on forehead and check bone. Leave dried scabbed wounds open to air.  Pt was given Network engineer at [facility], implement use here. Nutritional level not optimal for wound healing, Nutritional consult if you agree. Pt on low bed for safety. We will not follow, but will remain available to this patient, to nursing, and the medical and/or surgical teams.  Please re-consult if we need to assist further.  Barnett Hatter, RN-C, WTA-C, OCA Wound Treatment Associate Ostomy Care Associate

## 2018-06-23 NOTE — Progress Notes (Signed)
Patient is being transferred to [facility] per MD orders via care link. Report has been given. Patient is currently resting. Patient did not complaining of pain in left shoulder.

## 2018-06-24 ENCOUNTER — Encounter (HOSPITAL_COMMUNITY): Payer: Self-pay

## 2018-06-24 ENCOUNTER — Other Ambulatory Visit: Payer: Self-pay

## 2018-06-24 DIAGNOSIS — M4802 Spinal stenosis, cervical region: Secondary | ICD-10-CM

## 2018-06-24 LAB — COMPREHENSIVE METABOLIC PANEL
ALT: 16 U/L (ref 0–44)
ANION GAP: 16 — AB (ref 5–15)
AST: 24 U/L (ref 15–41)
Albumin: 2.9 g/dL — ABNORMAL LOW (ref 3.5–5.0)
Alkaline Phosphatase: 181 U/L — ABNORMAL HIGH (ref 38–126)
BILIRUBIN TOTAL: 1.3 mg/dL — AB (ref 0.3–1.2)
BUN: 40 mg/dL — AB (ref 8–23)
CO2: 21 mmol/L — ABNORMAL LOW (ref 22–32)
Calcium: 9.8 mg/dL (ref 8.9–10.3)
Chloride: 103 mmol/L (ref 98–111)
Creatinine, Ser: 3.13 mg/dL — ABNORMAL HIGH (ref 0.61–1.24)
GFR, EST AFRICAN AMERICAN: 21 mL/min — AB (ref >60)
GFR, EST NON AFRICAN AMERICAN: 18 mL/min — AB (ref >60)
Glucose, Bld: 220 mg/dL — ABNORMAL HIGH (ref 70–99)
POTASSIUM: 4.8 mmol/L (ref 3.5–5.1)
Sodium: 140 mmol/L (ref 135–145)
TOTAL PROTEIN: 7 g/dL (ref 6.5–8.1)

## 2018-06-24 LAB — CBC
HCT: 49 % (ref 39.0–52.0)
HEMOGLOBIN: 15.7 g/dL (ref 13.0–17.0)
MCH: 30.4 pg (ref 26.0–34.0)
MCHC: 32 g/dL (ref 30.0–36.0)
MCV: 95 fL (ref 78.0–100.0)
Platelets: 233 10*3/uL (ref 150–400)
RBC: 5.16 MIL/uL (ref 4.22–5.81)
RDW: 16.7 % — ABNORMAL HIGH (ref 11.5–15.5)
WBC: 10.2 10*3/uL (ref 4.0–10.5)

## 2018-06-24 LAB — GLUCOSE, CAPILLARY
GLUCOSE-CAPILLARY: 232 mg/dL — AB (ref 70–99)
GLUCOSE-CAPILLARY: 238 mg/dL — AB (ref 70–99)
Glucose-Capillary: 229 mg/dL — ABNORMAL HIGH (ref 70–99)
Glucose-Capillary: 260 mg/dL — ABNORMAL HIGH (ref 70–99)

## 2018-06-24 LAB — AMMONIA: AMMONIA: 39 umol/L — AB (ref 9–35)

## 2018-06-24 MED ORDER — MUPIROCIN 2 % EX OINT
1.0000 "application " | TOPICAL_OINTMENT | Freq: Two times a day (BID) | CUTANEOUS | Status: DC
  Administered 2018-06-24 – 2018-06-25 (×3): 1 via NASAL
  Filled 2018-06-24: qty 22

## 2018-06-24 MED ORDER — RESOURCE THICKENUP CLEAR PO POWD
ORAL | Status: DC | PRN
  Administered 2018-06-24: 13:00:00 via ORAL
  Filled 2018-06-24: qty 125

## 2018-06-24 MED ORDER — CHLORHEXIDINE GLUCONATE CLOTH 2 % EX PADS
6.0000 | MEDICATED_PAD | Freq: Every day | CUTANEOUS | Status: DC
  Administered 2018-06-25: 6 via TOPICAL

## 2018-06-24 NOTE — Progress Notes (Signed)
Pt difficult to get to take oral medication.  I was able to get metoprolol in applesauce and maybe about 45 ml of lactulose.  Will give a break and try again in a little bit

## 2018-06-24 NOTE — Plan of Care (Signed)
  Problem: Education: Goal: Knowledge of General Education information will improve Description Including pain rating scale, medication(s)/side effects and non-pharmacologic comfort measures Outcome: Not Progressing   Problem: Activity: Goal: Risk for activity intolerance will decrease Outcome: Not Progressing   Problem: Pain Managment: Goal: General experience of comfort will improve Outcome: Progressing   Problem: Safety: Goal: Ability to remain free from injury will improve Outcome: Progressing

## 2018-06-24 NOTE — Progress Notes (Signed)
[facility] PROGRESS NOTE  Tam Delisle ZOX:[id number] DOB: [birth year] DOA: 06/23/2018  PCP: Barnett Hatter, MD  Brief History/Interval Summary: 77 y.o. 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 Pt was initially admitted to [facility] hospital on 06/21/18 due to altered mental status. Pt has been treated for suspected hepatic encephalopathy with lactulose.  Mental status has improved, but was found to have new onset letter upper extremity weakness. Neurology recommended CT head, cervical spine, and angiogram of chest which were unrevealing. MRI of cervical spine recommended to assess for cord contusion, however unable to perform at [place] due to PPM.   Reason for Visit: Bilateral upper extremity weakness  Consultants: Neurology.  Neurosurgery.  Procedures: None yet  Antibiotics: Ceftriaxone  Subjective/Interval History: Patient somnolent this morning.  Does open his eyes but goes right back to sleep.  His wife is at the bedside.    ROS: Unable to do this morning  Objective:  Vital Signs  Vitals:   06/23/18 2007 06/24/18 0100 06/24/18 0400 06/24/18 0550  BP: 115/78 112/80  (!) 112/95  Pulse: (!) 107 (!) 115 (!) 105 (!) 109  Resp: 18 18  18   Temp: 98 F (36.7 C) 98 F (36.7 C)  98.6 F (37 C)  TempSrc: Oral Oral  Oral  SpO2: 92% 92%  90%  Weight:    101.9 kg  Height: 5\' 10"  (1.778 m)       Intake/Output Summary (Last 24 hours) at 06/24/2018 0833 Last data filed at 06/24/2018 0500 Gross per 24 hour  Intake 633.99 ml  Output 100 ml  Net 533.99 ml   Filed Weights   06/24/18 0550  Weight: 101.9 kg    General appearance: Somnolent.  Arousable.  In no distress. Resp: Normal effort at rest.  Diminished air entry at the  bases.  No definite wheezing rales or rhonchi.   Cardio: S1-S2 is normal regular.  No S3-S4.  No rubs murmurs or bruit GI: Abdomen is noted to be mildly distended.  Nontender.  No masses organomegaly Extremities: Minimal lower extremity edema.  Covered in dressing. Skin: Multiple skin tears and bruising noted all over his body. Neurologic: Somnolent.  Arousable.  Goes right back to sleep.  No obvious focal neurological deficits noted.    Lab Results:  Data Reviewed: I have personally reviewed following labs and imaging studies  CBC: Recent Labs  Lab 06/21/18 0525 06/23/18 0620  WBC 10.1 10.7*  NEUTROABS  --  7.9*  HGB 15.3 16.5  HCT 44.2 50.8  MCV 93.3 95.3  PLT 201 210    Basic Metabolic Panel: Recent Labs  Lab 06/21/18 0525 06/21/18 0727 06/22/18 0559 06/23/18 0620  NA 134*  --  141 141  K 4.6  --  3.3* 3.4*  CL 97*  --  106 101  CO2 23  --  27 25  GLUCOSE 176*  --  154* 168*  BUN 32*  --  31* 28*  CREATININE 2.14*  --  1.90* 2.11*  CALCIUM 10.0  --  9.9 10.1  MG  --  2.2  --  2.2  PHOS  --  2.9  --   --     GFR: Estimated Creatinine Clearance: 35.6 mL/min (A) (by C-G formula based on SCr of 2.11 mg/dL (H)).  Liver Function  Tests: Recent Labs  Lab 06/21/18 0525 06/23/18 0620  AST 45* 27  ALT 21 18  ALKPHOS 224* 194*  BILITOT 2.2* 1.5*  PROT 7.9 7.6  ALBUMIN 3.6 3.1*     Recent Labs  Lab 06/21/18 0644 06/22/18 0835  AMMONIA 36* 11    Coagulation Profile: Recent Labs  Lab 06/21/18 0727  INR 1.33    CBG: Recent Labs  Lab 06/23/18 0802 06/23/18 1157 06/23/18 1649 06/23/18 2135 06/24/18 0738  GLUCAP 155* 152* 178* 160* 232*    Thyroid Function Tests: Recent Labs    06/21/18 1336  TSH 1.108    Anemia Panel: Recent Labs    06/21/18 1336  VITAMINB12 528  FOLATE 10.3    Recent Results (from the past 240 hour(s))  MRSA PCR Screening     Status: Abnormal   Collection Time: 06/21/18 10:21 AM  Result Value Ref Range Status    MRSA by PCR POSITIVE (A) NEGATIVE Final    Comment:        The GeneXpert MRSA Assay (FDA approved for NASAL specimens only), is one component of a comprehensive MRSA colonization surveillance program. It is not intended to diagnose MRSA infection nor to guide or monitor treatment for MRSA infections. RESULT CALLED TO, READ BACK BY AND VERIFIED WITH: BERENICE AJEJO AT 1154 ON 06/21/2018, KP Performed at [facility], [address] [place], [place] [address]   Urine culture     Status: Abnormal   Collection Time: 06/21/18  4:30 PM  Result Value Ref Range Status   Specimen Description   Final    URINE, RANDOM Performed at [facility], [address] [place], [place] [address]    Special Requests   Final    NONE Performed at [facility], [address], [place], [place] [address]    Culture (A)  Final    <10,000 COLONIES/mL INSIGNIFICANT GROWTH Performed at [facility], [address], [place], [place] [address]    Report Status 06/23/2018 FINAL  Final  Culture, blood (Routine X 2) w Reflex to ID Panel     Status: None (Preliminary result)   Collection Time: 06/23/18  6:20 AM  Result Value Ref Range Status   Specimen Description BLOOD LEFT ANTECUBITAL  Final   Special Requests   Final    BOTTLES DRAWN AEROBIC AND ANAEROBIC Blood Culture adequate volume   Culture   Final    NO GROWTH < 12 HOURS Performed at [facility], [address], [place], [place] [address]    Report Status PENDING  Incomplete  Culture, blood (Routine X 2) w Reflex to ID Panel     Status: None (Preliminary result)   Collection Time: 06/23/18  6:27 AM  Result Value Ref Range Status   Specimen Description BLOOD LEFT ARM  Final   Special Requests   Final    BOTTLES DRAWN AEROBIC ONLY Blood Culture adequate volume   Culture   Final    NO GROWTH < 12 HOURS Performed at [facility], [address] [place], [place] [address]    Report Status PENDING   Incomplete      Radiology Studies: Ct Cervical Spine Wo Contrast  Result Date: 06/22/2018 CLINICAL DATA:  Bilateral upper extremity weakness after fall. EXAM: CT CERVICAL SPINE WITHOUT CONTRAST TECHNIQUE: Multidetector CT imaging of the cervical spine was performed without intravenous contrast. Multiplanar CT image reconstructions were also generated. COMPARISON:  CT scan of June 21, 2018.  FINDINGS: Alignment: Normal. Skull base and vertebrae: No acute fracture. No primary bone lesion or focal pathologic process. Soft tissues and spinal canal: No prevertebral fluid or swelling. No visible canal hematoma. Disc levels: Moderate anterior osteophyte formation is noted at C4-5 and C5-6. Moderate degenerative disc disease is noted at C6-7 and C7-T1. Mild to moderate right-sided neural foraminal stenosis is noted at C6-7 secondary to uncovertebral spurring. Upper chest: Negative. Other: None. IMPRESSION: No fracture or spondylolisthesis is noted in the cervical spine. Multilevel degenerative disc disease is noted. Mild to moderate right-sided neural foraminal stenosis is noted at C6-7 secondary to uncovertebral spurring. Electronically Signed   By: Lupita Raider, M.D.   On: 06/22/2018 17:24   Mr Brain Wo Contrast  Result Date: 06/23/2018 CLINICAL DATA:  Stroke.  Bilateral arm weakness. EXAM: MRI HEAD WITHOUT CONTRAST TECHNIQUE: Multiplanar, multiecho pulse sequences of the brain and surrounding structures were obtained without intravenous contrast. COMPARISON:  CT head 06/21/2018 FINDINGS: Brain: MRI compatible pacemaker was monitored during the scan. No adverse effects. Moderate atrophy. Negative for acute infarct. Chronic hemorrhagic infarct right occipital lobe. Small chronic infarct right thalamus. Negative for mass or edema. Vascular: Normal arterial flow voids Skull and upper cervical spine: Negative Sinuses/Orbits: Bilateral cataract surgery.  Paranasal sinuses clear Other: None IMPRESSION: No acute  abnormality Chronic hemorrhagic infarct right occipital lobe with generalized atrophy. Electronically Signed   By: Marlan Palau M.D.   On: 06/23/2018 16:00   Mr Cervical Spine Wo Contrast  Result Date: 06/23/2018 CLINICAL DATA:  Recent fall. Bilateral upper extremity weakness. Myelopathy. EXAM: MRI CERVICAL SPINE WITHOUT CONTRAST TECHNIQUE: Multiplanar, multisequence MR imaging of the cervical spine was performed. No intravenous contrast was administered. COMPARISON:  Cervical spine CT 06/22/2018 FINDINGS: Multiple sequences are motion degraded with severe motion on the axial gradient echo sequence and milder motion on the axial spin echo sequence. Significant artifact is also present on the sagittal STIR and sagittal gradient echo sequences. Alignment: Trace retrolisthesis of C3 on C4. Vertebrae: No fracture, suspicious osseous lesion, or significant marrow edema. Cord: Possible low level T2/STIR hyperintensity in the cord at C3-4. No gross cord hemorrhage. Posterior Fossa, vertebral arteries, paraspinal tissues: At most trace prevertebral fluid in the mid and lower cervical spine without other findings to clearly indicate ligamentous injury. At most minimal posterior soft tissue edema. Grossly preserved vertebral artery flow voids. Disc levels: Assessment of degenerative changes is limited by motion artifact on axial imaging. The cervical spinal canal is mildly small in caliber diffusely on a congenital basis. C2-3: Disc bulging, uncovertebral spurring, and mild facet arthrosis result in mild spinal stenosis and mild bilateral neural foraminal stenosis. C3-4: Disc bulging, broad posterior disc protrusion (partially calcified on CT), mild infolding of the ligamentum flavum, and mild facet arthrosis result in severe spinal stenosis with moderate cord flattening and moderate to severe right and severe left neural foraminal stenosis. C4-5: Disc bulging, uncovertebral spurring, and mild facet arthrosis result in  moderate spinal stenosis and mild right and moderate left neural foraminal stenosis. C5-6: Mild disc bulging, small central disc protrusion, and minimal uncovertebral spurring result in mild spinal stenosis and mild bilateral neural foraminal stenosis. C6-7: Broad-based posterior disc osteophyte complex including a large right paracentral and foraminal component results in mild spinal stenosis with slight right ventral cord flattening and severe right and mild left neural foraminal stenosis. C7-T1: Negative. IMPRESSION: 1. Motion degraded examination without definite acute osseous abnormality or ligamentous injury identified. 2. 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 3. 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 4. Moderate spinal stenosis at C4-5 and milder spinal stenosis at the other levels. 5. Severe multilevel neural foraminal stenosis. Electronically Signed   By: Sebastian Ache M.D.   On: 06/23/2018 16:13   Ct Angio Chest Aorta W/cm &/or Wo/cm  Result Date: 06/22/2018 CLINICAL DATA:  77 year old male with weakness and fall. EXAM: CT ANGIOGRAPHY CHEST WITH CONTRAST TECHNIQUE: Multidetector CT imaging of the chest was performed using the standard protocol during bolus administration of intravenous contrast. Multiplanar CT image reconstructions and MIPs were obtained to evaluate the vascular anatomy. CONTRAST:  75mL OMNIPAQUE IOHEXOL 350 MG/ML SOLN COMPARISON:  None. FINDINGS: Cardiovascular: This is a technically adequate study for evaluation of aortic aneurysm/dissection. This is technically borderline for evaluation of pulmonary emboli. Marked cardiomegaly identified. Coronary artery and aortic atherosclerotic calcifications noted without thoracic aortic aneurysm. Pacemaker identified. No pericardial effusion. No definite pulmonary emboli identified. Mediastinum/Nodes: No enlarged mediastinal, hilar, or axillary lymph nodes. Thyroid  gland, trachea, and esophagus demonstrate no significant findings. Lungs/Pleura: A small LEFT pleural effusion is noted. Bilateral LOWER lobe airspace disease/consolidation/atelectasis noted. Multiple calcified granulomas within the lungs noted. No evidence of pneumothorax. Upper Abdomen: Numerous heterogeneous enhancing round lesions within the spleen identified, the largest measuring 3 cm (series 5: Image 74). Musculoskeletal: No acute or suspicious bony abnormalities noted. Review of the MIP images confirms the above findings. IMPRESSION: 1. No evidence of thoracic aortic aneurysm/dissection or definite pulmonary emboli. No hematoma. 2. Bilateral LOWER lobe opacities which may represent airspace disease/pneumonia or atelectasis. Small LEFT pleural effusion. 3. Indeterminate enhancing splenic lesions. Dedicated CT or MRI of the abdomen/pelvis recommended. 4. Marked cardiomegaly. 5. Coronary artery and Aortic Atherosclerosis (ICD10-I70.0). Electronically Signed   By: Harmon Pier M.D.   On: 06/22/2018 17:28     Medications:  Scheduled: . bacitracin   Topical BID  . dexamethasone  10 mg Intravenous Q6H  . heparin injection (subcutaneous)  5,000 Units Subcutaneous Q8H  . insulin aspart  0-9 Units Subcutaneous TID WC  . insulin glargine  15 Units Subcutaneous QHS  . lactulose  30 g Oral BID  . levothyroxine  175 mcg Oral QAC breakfast  . metoprolol tartrate  25 mg Oral BID  . nystatin  1 g Topical BID   Continuous: . sodium chloride 50 mL/hr at 06/23/18 2055  . 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  He  underwent CT scan of the head and cervical spine which did not show any thing acute.  MRI could not be done there as the patient has a pacemaker.  He was transferred here.  Found to have significant cervical spine stenosis with cord flattening and cord edema noted on MRI.  Neurosurgery was consulted.  Patient started on dexamethasone.  He will most likely need surgical intervention to correct this.  Neurosurgery was planning this sometime next week allowing the steroids time to decrease the edema.  Discussed with his wife today.  She would prefer that he get care at [facility].  Discussed with Dr. Jordan Likes this morning.  Will contact [facility] providers.    Acute metabolic encephalopathy This was noted at the other  facility.  He was found to have minimally elevated ammonia level and was placed on lactulose.  Noted to be somnolent this morning.  MRI brain done yesterday did not show any acute stroke.  Chronic infarct was noted.  Patient was given morphine overnight.  He received multiple doses of Ativan and Haldol yesterday for him to undergo MRI.  He was started on dexamethasone.  Unclear if this is due to medications.  Ammonia level noted to be 39 this morning.  If he is not able to take lactulose orally he will be given enemas.  Monitor mental status closely.  He is protecting his airway.  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  However this was discussed with his wife.  She tells me that there is no diagnosis offered for his liver condition.  He underwent MRI of his hepatobiliary system recently which I did not actually even show findings of liver cirrhosis.  Hepatic congestion was noted which was thought to be due to his diastolic CHF.  Unknown if he has been evaluated by gastroenterology.  His LFTs are normal.  His bilirubin is 1.3.  Does not need GI input at this time.  Continue to follow labs.  He can pursue this further in the outpatient setting.   Ammonia level again noted to be mildly elevated today.  He is on lactulose but unclear if he is able to take this orally.  Noted to be on ceftriaxone for reasons are unclear, presumably for SBP prophylaxis. Was previously on Bactrim.  Abnormal appearing urine Moderate hemoglobin trace leukocytes 11-20 RBC 11-20 WBC noted.  Urine culture did not show any significant growth.  Unlikely the patient has clinically relevant urinary tract infection.  Diabetes mellitus type II Noted to be on glargine insulin.  Continue to monitor CBGs.  SSI.  Acute on chronic kidney disease stage III/Hypokalemia Baseline creatinine appears to be around 1.5.  Creatinine noted to be higher today at 3.13.  Most likely due to poor oral intake.  IV fluid rate will be increased.  His weight is stable.  No urine noted on bladder scan.  Discussed with the nursing staff.  Potassium is normal today.  He was on Bactrim at the outside facility which was discontinued at admission here.  Atrial fibrillation Patient was on Eliquis.  Patient is on metoprolol.  Eliquis is on hold as he will need cervical spine surgery.  His chads 2 vascular score is 7.  Previous history of stroke Stable.  Noted to be on statin.  Has been on anticoagulation which is on hold for surgery.  Hypothyroidism Continue home medications.  TSH was 1.1 on 10/2.  History of gout Continue allopurinol.  Chronic combined systolic and diastolic CHF Echocardiogram from August 2019 showed a EF of 35%.  Patient was on diuretics which are on hold due to worsening renal function.  His weight was recorded as 102.1 kg on 10/3.  Previous records reviewed.  His weight was 104.6 kg on 9/8.  Patient appears to be on the hypovolemic side.  This is likely due to poor oral intake.  IV fluids to be given gently.  Monitor volume status closely.  Multiple skin lesions abrasions and skin tears Wound care consult  Abnormal finding on CT-head 06/21/18 CT-head showed a 1 cm lytic  lesion RIGHT frontal calvarium. This could reflect atypical hemangioma per radiologist, though metastatic disease not excluded. Of note, pt was found to have LEFT periauricular  metallic foreign bodies on CT-scan, MRI may be nondiagnostic though not contraindicated per radiologist. -f/u to repeat CT-head as outpt with PCP  ADDENDUM Discussed with the [facility] neurosurgeon Dr. Cassell Smiles.  Apparently his neurosurgery schedule is full for the next week.  He thinks by transferring patient to [facility] there might be a delay in his care.  So he recommends keeping the patient here in this hospital.  This was communicated to the patient's family.  DVT Prophylaxis: Subcutaneous heparin    Code Status: DNR Family Communication: Discussed with his wife Disposition Plan: Management as outlined above.    LOS: 1 day   Osvaldo Shipper  [facility] Pager [phone] 06/24/2018, 8:33 AM  If 7PM-7AM, please contact night-coverage at [email or web address], password [facility]

## 2018-06-24 NOTE — Progress Notes (Signed)
  Speech Language Pathology Treatment: Dysphagia  Patient Details Name: Leonard Welch MRN: [id number] DOB: Oct 20, 1940 Today's Date: 06/24/2018 Time: 6213-0865 SLP Time Calculation (min) (ACUTE ONLY): 32 min  Assessment / Plan / Recommendation Clinical Impression  Pt is drowsy but can be aroused sufficiently for PO intake, although his awareness and acceptance remains limited. Mod-Max cues were provided for acceptance, with pt intermittently biting on the straw, spitting out liquids x1. Thin liquids elicit multiple (4-5) subswallows per bolus, followed by immediate coughing that is subjectively seems weak with concern for ineffective airway protection. Given bites of pudding thick liquids, he has no extra swallowing and no overt signs of aspiration. I think mentation will likely limit how much pt can take in right now. Although I would not start sending full meal trays, would consider providing snacks from the floor stock that are pureed/pudding thick consistency. Meds could be given crushed in puree. Would offer pt small spoonfuls when he is fully alert and accepting, holding otherwise. SLP will continue to follow. Question if this is an acute exacerbation of mild, baseline dysphagia, although pt's wife declines any h/o difficulty. As pt becomes more alert and starts taking in more POs, would consider MBS if signs of dysphagia persist.   HPI HPI: Pt is a 77 y.o. 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 He was initially admitted to [facility] hospital 10/2 with AMS. Pt was treated for suspected hepatic encephalopathy with lactulose.  Mental status improved, but pt developed BUE weakness. CT head, cervical spine, and angiogram of chest were unrevealing (although did note osteophytes C4-5 and C5-6). Pt was transferred to [facility] for further w/u including MRI  of cervical spine. Pt was noted to be coughing with PO intake 10/4, therefore SLP was ordered.      SLP Plan  Continue with current plan of care       Recommendations  Diet recommendations: Other(comment)(bites of puree/pudding thick liquids from floor stock) Liquids provided via: Teaspoon Medication Administration: Crushed with puree Supervision: Staff to assist with self feeding;Full supervision/cueing for compensatory strategies Compensations: Minimize environmental distractions;Slow rate;Small sips/bites Postural Changes and/or Swallow Maneuvers: Seated upright 90 degrees;Upright 30-60 min after meal                Oral Care Recommendations: Oral care QID Follow up Recommendations: (tba) SLP Visit Diagnosis: Dysphagia, unspecified (R13.10) Plan: Continue with current plan of care       GO                [name] 06/24/2018, 9:45 AM  [name], M.A. CCC-SLP Acute Herbalist [phone] Office [phone]

## 2018-06-25 ENCOUNTER — Inpatient Hospital Stay (HOSPITAL_COMMUNITY): Payer: Medicare Other

## 2018-06-25 ENCOUNTER — Encounter (HOSPITAL_COMMUNITY): Payer: Self-pay

## 2018-06-25 LAB — ECHOCARDIOGRAM COMPLETE
HEIGHTINCHES: 70 in
Weight: 3600 oz

## 2018-06-25 LAB — BLOOD CULTURE ID PANEL (REFLEXED)
Acinetobacter baumannii: NOT DETECTED
CANDIDA KRUSEI: NOT DETECTED
Candida albicans: NOT DETECTED
Candida glabrata: NOT DETECTED
Candida parapsilosis: NOT DETECTED
Candida tropicalis: NOT DETECTED
ENTEROCOCCUS SPECIES: NOT DETECTED
Enterobacter cloacae complex: NOT DETECTED
Enterobacteriaceae species: NOT DETECTED
Escherichia coli: NOT DETECTED
Haemophilus influenzae: NOT DETECTED
Klebsiella oxytoca: NOT DETECTED
Klebsiella pneumoniae: NOT DETECTED
Listeria monocytogenes: NOT DETECTED
Methicillin resistance: DETECTED — AB
Neisseria meningitidis: NOT DETECTED
Proteus species: NOT DETECTED
Pseudomonas aeruginosa: NOT DETECTED
SERRATIA MARCESCENS: NOT DETECTED
STAPHYLOCOCCUS AUREUS BCID: NOT DETECTED
STAPHYLOCOCCUS SPECIES: DETECTED — AB
STREPTOCOCCUS AGALACTIAE: NOT DETECTED
Streptococcus pneumoniae: NOT DETECTED
Streptococcus pyogenes: NOT DETECTED
Streptococcus species: NOT DETECTED

## 2018-06-25 LAB — BASIC METABOLIC PANEL
ANION GAP: 17 — AB (ref 5–15)
BUN: 70 mg/dL — ABNORMAL HIGH (ref 8–23)
CALCIUM: 9.5 mg/dL (ref 8.9–10.3)
CO2: 16 mmol/L — AB (ref 22–32)
Chloride: 102 mmol/L (ref 98–111)
Creatinine, Ser: 4.71 mg/dL — ABNORMAL HIGH (ref 0.61–1.24)
GFR, EST AFRICAN AMERICAN: 13 mL/min — AB (ref >60)
GFR, EST NON AFRICAN AMERICAN: 11 mL/min — AB (ref >60)
Glucose, Bld: 310 mg/dL — ABNORMAL HIGH (ref 70–99)
Potassium: 5.1 mmol/L (ref 3.5–5.1)
Sodium: 135 mmol/L (ref 135–145)

## 2018-06-25 LAB — GLUCOSE, CAPILLARY
GLUCOSE-CAPILLARY: 313 mg/dL — AB (ref 70–99)
Glucose-Capillary: 292 mg/dL — ABNORMAL HIGH (ref 70–99)
Glucose-Capillary: 223 mg/dL — ABNORMAL HIGH (ref 70–99)
Glucose-Capillary: 235 mg/dL — ABNORMAL HIGH (ref 70–99)

## 2018-06-25 LAB — CULTURE, BLOOD (ROUTINE X 2): Special Requests: ADEQUATE

## 2018-06-25 LAB — AMMONIA: Ammonia: 84 umol/L — ABNORMAL HIGH (ref 9–35)

## 2018-06-25 LAB — HEMOGLOBIN A1C
Hgb A1c MFr Bld: 6.7 % — ABNORMAL HIGH (ref 4.8–5.6)
Mean Plasma Glucose: 146 mg/dL

## 2018-06-25 MED ORDER — PERFLUTREN LIPID MICROSPHERE
1.0000 mL | INTRAVENOUS | Status: AC | PRN
  Administered 2018-06-25: 3 mL via INTRAVENOUS
  Filled 2018-06-25: qty 10

## 2018-06-25 MED ORDER — METOPROLOL TARTRATE 50 MG PO TABS
50.0000 mg | ORAL_TABLET | Freq: Two times a day (BID) | ORAL | Status: DC
  Administered 2018-06-25 – 2018-06-26 (×2): 50 mg via ORAL
  Filled 2018-06-25 (×2): qty 1

## 2018-06-25 MED ORDER — HALOPERIDOL LACTATE 5 MG/ML IJ SOLN
2.5000 mg | Freq: Once | INTRAMUSCULAR | Status: AC
  Administered 2018-06-25: 22:00:00 via INTRAVENOUS
  Filled 2018-06-25: qty 1

## 2018-06-25 MED ORDER — HALOPERIDOL LACTATE 5 MG/ML IJ SOLN
2.0000 mg | Freq: Once | INTRAMUSCULAR | Status: AC
  Administered 2018-06-25: 2 mg via INTRAVENOUS
  Filled 2018-06-25: qty 1

## 2018-06-25 MED ORDER — METOPROLOL TARTRATE 5 MG/5ML IV SOLN
2.5000 mg | Freq: Once | INTRAVENOUS | Status: AC
  Administered 2018-06-25: 2.5 mg via INTRAVENOUS
  Filled 2018-06-25: qty 5

## 2018-06-25 MED ORDER — LACTULOSE ENEMA
300.0000 mL | Freq: Once | ORAL | Status: AC
  Administered 2018-06-25: 300 mL via RECTAL
  Filled 2018-06-25: qty 300

## 2018-06-25 MED ORDER — INSULIN GLARGINE 100 UNIT/ML ~~LOC~~ SOLN
20.0000 [IU] | Freq: Every day | SUBCUTANEOUS | Status: DC
  Administered 2018-06-25: 20 [IU] via SUBCUTANEOUS
  Filled 2018-06-25: qty 0.2

## 2018-06-25 MED ORDER — LACTULOSE 10 GM/15ML PO SOLN
30.0000 g | Freq: Three times a day (TID) | ORAL | Status: DC
  Administered 2018-06-25 – 2018-06-26 (×3): 30 g via ORAL
  Filled 2018-06-25 (×3): qty 45

## 2018-06-25 MED ORDER — ORAL CARE MOUTH RINSE
15.0000 mL | Freq: Two times a day (BID) | OROMUCOSAL | Status: DC
  Administered 2018-06-25 – 2018-06-26 (×2): 15 mL via OROMUCOSAL

## 2018-06-25 MED ORDER — LACTULOSE ENEMA
300.0000 mL | Freq: Two times a day (BID) | ORAL | Status: DC
  Filled 2018-06-25: qty 300

## 2018-06-25 MED ORDER — SODIUM CHLORIDE 0.9 % IV SOLN
INTRAVENOUS | Status: DC | PRN
  Administered 2018-06-25: 250 mL via INTRAVENOUS

## 2018-06-25 NOTE — Progress Notes (Signed)
SLP Cancellation Note  Patient Details Name: Leonard Welch MRN: [id number] DOB: [birth year]   Cancelled treatment:       Reason Eval/Treat Not Completed: Patient at procedure or test/unavailable - off the floor for renal ultrasound. Per RN, taking meds well and palliative care consult is pending. Will plan to f/u as able, but would continue bites of purees and pudding thick liquids for today.    [name] 06/25/2018, 3:07 PM  [name], M.A. CCC-SLP Acute Herbalist [phone] Office [phone]

## 2018-06-25 NOTE — Plan of Care (Signed)
  Problem: Education: Goal: Knowledge of General Education information will improve Description: Including pain rating scale, medication(s)/side effects and non-pharmacologic comfort measures Outcome: Progressing   Problem: Clinical Measurements: Goal: Respiratory complications will improve Outcome: Progressing   Problem: Elimination: Goal: Will not experience complications related to bowel motility Outcome: Progressing   

## 2018-06-25 NOTE — Progress Notes (Signed)
Nurse accompanied transporter with pt in bed to u/s, pt calls out for karen and wants to go back to room, reriented and reassurance given to pt. Pt stable otherwise

## 2018-06-25 NOTE — Progress Notes (Signed)
Patient more awake and interactive today.  He denies pain.  His mental status is still not quite back to baseline.  He is confused and perseverates somewhat.  Most of the history is once again obtained through his wife.  He is afebrile.  Heart rate is elevated.  Blood pressure is normal.  Oxygenating without need of supplemental O2.  Poor urine output.  He is awake.  He answers questions.  He follows commands.  Cranial nerve function is normal bilateral.  Motor examination reveals 2-3 over 5 strength in his right triceps muscle group.  He has 1-2 over 5 strength in his right biceps.  He has 0/5 strength in his right grips and intrinsics.  On the left side he has 2/5 strength in his left triceps.  He has 1/5 strength in his left biceps.  He has no grip or intrinsic strength.  Lower extremity strength he has 4-/5 strength in his left lower extremity.  He has 3/5 strength in his right lower extremity.  The patient has multiple serious ongoing medical issues including hepatic failure with some encephalopathy, diabetes mellitus, vascular disease, heart disease.  He is suffering symptoms of rather profound central spinal cord injury secondary to his fall.  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  I discussed situation with the patient and his wife.  I do think that there is a chance that he may benefit from anterior cervical decompression and fusion at C3-4.  Overall the patient is a very high operative risk and certainly there is no guarantee that he will have any significant improvement after decompression.  I have discussed the usual risks benefits and postoperative course with the patient and his wife.  They would like to proceed with surgery.  The earliest I would see doing this would be possibly Wednesday or Thursday this coming week.

## 2018-06-25 NOTE — Progress Notes (Signed)
  Echocardiogram 2D Echocardiogram with definity has been performed.  Leta Jungling M 06/25/2018, 2:05 PM

## 2018-06-25 NOTE — Consult Note (Addendum)
Renal Service Consult Note [facility]  Leonard Welch 06/25/2018 Leonard Welch Requesting Physician:  Dr Leonard Welch  Reason for Consult:  Acute on CRF HPI: The patient is a 77 y.o. year-old with hx of longstanding IDDM 2, PPM, CKD 3, gout and heart disease presented to [facility] on 10/2 w/ AMS, hx of hepatic enceph on lactulose.  MS imrpoved but then had bilat UE weakness.  CT of head and spine and CTA chest were done , he got IV contrast for the CTA on 10/3.  He needed MRI spine due to bilat UE weakness so was transferred to [facility].  MRI spine on 10/4 showed spinal cord compression and has been seen by NSurg, needs surgery but other issues needed to stabilize first (kidney, liver).  Since admission creat 2.0 has risen up to 4.71 today and UOP has dropped daily and hasn't put out any urine today.  Pt is confused and lethargic, mumbled speech, can't finish a thought verbally.  Asked to see for AKI.   [name] is at bedside and provided the history.  Pt very ill since Dec 2018 and has been in and out of hospitals and rehab facilities.  Has been home only 10d the last 4 months. They live in [place], [place] , have a kidney doctor there.  He was in hosp at [facility] in 12/18, then at [facility] in June 2019 both followed by Rehab stays.  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  Per the wife he had 3 days of dialysis during that stay and has since stated that "I'm never going to do dialysis again".    ROS  na/   Past Medical History  Past Medical History:  Diagnosis Date  . Cardiac pacemaker   . CKD (chronic kidney disease)   . Diabetes mellitus without complication ([place])   . Encephalopathy   . Gout   . Heart disease    Past Surgical History  Past Surgical History:  Procedure Laterality Date  . APPENDECTOMY     . CARDIOVERSION    . PACEMAKER PLACEMENT    . TONSILLECTOMY    . TOTAL HIP ARTHROPLASTY Right    Family History No family history on file. Social History  reports that he has never smoked. He has never used smokeless tobacco. He reports that he drinks alcohol. He reports that he does not use drugs. Allergies  Allergies  Allergen Reactions  . Amiodarone   . Warfarin And Related    Home medications Prior to Admission medications   Medication Sig Start Date End Date Taking? Authorizing Provider  acetaminophen (TYLENOL) 325 MG tablet Take 650 mg by mouth every 4 (four) hours as needed.   Yes [provider]  allopurinol (ZYLOPRIM) 300 MG tablet Take 300 mg by mouth daily.   Yes [provider]  apixaban (ELIQUIS) 2.5 MG TABS tablet Take 2.5 mg by mouth 2 (two) times daily.   Yes [provider]  bumetanide (BUMEX) 1 MG tablet Take 1 mg by mouth daily as needed (edema).    Yes [provider]  bumetanide (BUMEX) 2 MG tablet Take 2 mg by mouth daily.   Yes [provider]  docusate sodium (COLACE) 100 MG capsule Take 100 mg by mouth 2 (two) times daily.   Yes [provider]  glucosamine-chondroitin 500-400 MG tablet Take 1 tablet by mouth 2 (two) times daily.   Yes [provider]  insulin aspart (NOVOLOG) 100 UNIT/ML injection Inject 10 Units into the skin 3 (three) times daily with meals.   Yes [provider]  insulin glargine (LANTUS) 100 UNIT/ML injection Inject 18 Units into the skin at bedtime.   Yes [provider]  lactulose (CHRONULAC) 10 GM/15ML solution Take 45 mLs (30 g total) by mouth 2 (two) times daily. 06/22/18  Yes Leonard Basta, MD  levothyroxine (SYNTHROID, LEVOTHROID) 175 MCG tablet Take 175 mcg by mouth daily before breakfast.   Yes [provider]  Melatonin 5 MG TABS Take 1 tablet by mouth at bedtime as needed.   Yes [provider]  metoprolol tartrate  (LOPRESSOR) 25 MG tablet Take 25 mg by mouth 2 (two) times daily.   Yes [provider]  Multiple Vitamins-Minerals (CERTAVITE SENIOR/ANTIOXIDANT) TABS Take 1 tablet by mouth daily.   Yes [provider]  mupirocin ointment (BACTROBAN) 2 % Apply 1 application topically daily. Apply to Affected area once daily 05/08/18  Yes [provider]  nystatin (NYSTATIN) powder Apply 1 g topically 2 (two) times daily.   Yes [provider]  polyethylene glycol (MIRALAX / GLYCOLAX) packet Take 17 g by mouth daily.   Yes [provider]  pravastatin (PRAVACHOL) 10 MG tablet Take 10 mg by mouth at bedtime.   Yes [provider]  senna (SENOKOT) 8.6 MG tablet Take 1 tablet by mouth 2 (two) times daily.   Yes [provider]  spironolactone (ALDACTONE) 25 MG tablet Take 25 mg by mouth daily.   Yes [provider]  sulfamethoxazole-trimethoprim (BACTRIM DS,SEPTRA DS) 800-160 MG tablet Take 1 tablet by mouth 2 (two) times daily.   Yes [provider]   Liver Function Tests Recent Labs  Lab 06/21/18 0525 06/23/18 0620 06/24/18 0631  AST 45* 27 24  ALT _0 ALKPHOS 224* 194* 181*  BILITOT 2.2* 1.5* 1.3*  PROT 7.9 7.6 7.0  ALBUMIN 3.6 3.1* 2.9*   No results for input(s): LIPASE, AMYLASE in the last 168 hours. CBC Recent Labs  Lab 06/21/18 0525 06/23/18 0620 06/24/18 0752  WBC 10.1 10.7* 10.2  NEUTROABS  --  7.9*  --   HGB 15.3 16.5 15.7  HCT 44.2 50.8 49.0  MCV 93.3 95.3 95.0  PLT 201 210 407   Basic Metabolic Panel Recent Labs  Lab 06/21/18 0525 06/21/18 0727 06/22/18 0559 06/23/18 0620 06/24/18 0631 06/25/18 0900  NA 134*  --  141 141 140 135  K 4.6  --  3.3* 3.4* 4.8 5.1  CL 97*  --  106 101 103 102  CO2 23  --  27 25 21* 16*  GLUCOSE 176*  --  154* 168* 220* 310*  BUN 32*  --  31* 28* 40* 70*  CREATININE 2.14*  --  1.90* 2.11* 3.13* 4.71*  CALCIUM 10.0  --  9.9 10.1 9.8 9.5  PHOS  --  2.9  --   --    --   --    Iron/TIBC/Ferritin/ %Sat No results found for: IRON, TIBC, FERRITIN, IRONPCTSAT  Vitals:   06/25/18 0600 06/25/18 1106 06/25/18 1212 06/25/18 1222  BP: 131/90 126/80 (!) 121/93 113/87  Pulse: (!) 121 96 (!) 121 (!) 106  Resp: 18 18    Temp: 97.9 F (36.6 C) 97.9 F (36.6 C)    TempSrc: Oral Oral  SpO2: 93% 91%  92%  Weight: 102.1 kg     Height:       Exam Gen obese large framed WM, incoherent and lethargic, follows some simple commands No rash, cyanosis or gangrene Sclera anicteric, throat clear and moist  +JVD Chest clear bilat to bases, no rales or wheezing RRR +2/6 sem no RG Abd firm and distended, no gross ascites , no hsm  +bs GU normal w condom cath and no urine in foley tube/ bag MS no joint effusions or deformity, multiple scattered bruises/ scabs over the arms and legs Ext no sig LE or UE edema, no wounds or ulcers Neuro is alert, Ox 3 , nf    Home meds:  - bumex 1 qd/ metoprolol 25 bid/ spironolactone 25 qd  - statin/ T4/ allopurinol  - insluin glargine 18 hs/ insulin aspart 10 tid  - bactrim DS bid/ lactulose 30 bid  Impression/ Plan: 1. 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  Dropping UOP since contrast for CTA on 06/22/18, suspect contrast nephropathy.  Not making any urine.  BLadder scan yest was negative, going for renal US now.  Assuming renal US is negative, prognosis is very poor.  No HD per pt's wishes.  Will offer supportive care but in his overall poor condition, life expectancy with little to no renal function would not expected to be very long.  Recommend supportive care and consideration of transition to comfort care if not getting better in 1-2 days and/or if family requests.  They say they have been expecting something bad to happen but didn't know when, this may be it.  Have d/w wife and son and primary MD.  No other suggestions.  2. Cervical spinal stenosis/ bilat UE weakness - long term debility x last 9-10 months per family.   3. IDDM - long standing 4. 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   5. HTN - bp's good 6. Volume - no gross overload, CT chest no edema  Kelly Splinter MD [facility] pager [phone]   06/25/2018, 2:37 PM

## 2018-06-25 NOTE — Progress Notes (Signed)
Patient confused, and seems to have expressive aphasia.  Patient swallowing better tonight and constantly requesting ice or something to drink.

## 2018-06-25 NOTE — Progress Notes (Signed)
PHARMACY - PHYSICIAN COMMUNICATION CRITICAL VALUE ALERT - BLOOD CULTURE IDENTIFICATION (BCID)  Leonard Welch is an 77 y.o. male who presented to [facility] on 06/23/2018 with a chief complaint of b/l upper weakness  Assessment:  Growing staph species (mecA positive) in 1/4 blood cultures - likely contaminant.  Name of physician (or Provider) Contacted: Audrea Muscat  Current antibiotics: Rocephin for suspected UTI  Changes to prescribed antibiotics recommended:  Patient is on recommended antibiotics - No changes needed  Results for orders placed or performed during the hospital encounter of 06/23/18  Blood Culture ID Panel (Reflexed) (Collected: 06/23/2018  6:27 AM)  Result Value Ref Range   Enterococcus species NOT DETECTED NOT DETECTED   Listeria monocytogenes NOT DETECTED NOT DETECTED   Staphylococcus species DETECTED (A) NOT DETECTED   Staphylococcus aureus (BCID) NOT DETECTED NOT DETECTED   Methicillin resistance DETECTED (A) NOT DETECTED   Streptococcus species NOT DETECTED NOT DETECTED   Streptococcus agalactiae NOT DETECTED NOT DETECTED   Streptococcus pneumoniae NOT DETECTED NOT DETECTED   Streptococcus pyogenes NOT DETECTED NOT DETECTED   Acinetobacter baumannii NOT DETECTED NOT DETECTED   Enterobacteriaceae species NOT DETECTED NOT DETECTED   Enterobacter cloacae complex NOT DETECTED NOT DETECTED   Escherichia coli NOT DETECTED NOT DETECTED   Klebsiella oxytoca NOT DETECTED NOT DETECTED   Klebsiella pneumoniae NOT DETECTED NOT DETECTED   Proteus species NOT DETECTED NOT DETECTED   Serratia marcescens NOT DETECTED NOT DETECTED   Haemophilus influenzae NOT DETECTED NOT DETECTED   Neisseria meningitidis NOT DETECTED NOT DETECTED   Pseudomonas aeruginosa NOT DETECTED NOT DETECTED   Candida albicans NOT DETECTED NOT DETECTED   Candida glabrata NOT DETECTED NOT DETECTED   Candida krusei NOT DETECTED NOT DETECTED   Candida parapsilosis NOT DETECTED NOT DETECTED   Candida  tropicalis NOT DETECTED NOT DETECTED    Lavonia Dana 06/25/2018  5:53 AM

## 2018-06-25 NOTE — Progress Notes (Signed)
[facility] PROGRESS NOTE  Leonard Welch ZOX:[id number] DOB: 1940/12/09 DOA: 06/23/2018  PCP: Barnett Hatter, MD  Brief History/Interval Summary: 77 y.o. 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 Pt was initially admitted to [facility] on 06/21/18 due to altered mental status. Pt has been treated for suspected hepatic encephalopathy with lactulose.  Mental status has improved, but was found to have new onset letter upper extremity weakness. Neurology recommended CT head, cervical spine, and angiogram of chest which were unrevealing. MRI of cervical spine recommended to assess for cord contusion, however unable to perform at [place] due to PPM.   Reason for Visit: Bilateral upper extremity weakness  Consultants: Neurology.  Neurosurgery.  Nephrology will be consulted today.  Cardiology to be consulted today: Dr. Algie Coffer  Procedures: Transthoracic echocardiogram is pending  Antibiotics: Ceftriaxone  Subjective/Interval History: Patient noted to be quite confused this morning.  He is more alert than yesterday.  Noted to be agitated.  Wife is at the bedside.    ROS: Unable to do due to his encephalopathy  Objective:  Vital Signs  Vitals:   06/24/18 2229 06/25/18 0035 06/25/18 0600 06/25/18 1106  BP: 117/89 120/90 131/90 126/80  Pulse: (!) 105 (!) 113 (!) 121 96  Resp:  16 18 18   Temp:  98.1 F (36.7 C) 97.9 F (36.6 C) 97.9 F (36.6 C)  TempSrc:  Oral Oral Oral  SpO2:  94% 93% 91%  Weight:   102.1 kg   Height:        Intake/Output Summary (Last 24 hours) at 06/25/2018 1130 Last data filed at 06/25/2018 0634 Gross per 24 hour  Intake 1645.93 ml  Output 75 ml  Net 1570.93 ml   Filed Weights   06/24/18 0550 06/25/18  0600  Weight: 101.9 kg 102.1 kg    General appearance: Awake alert.  Agitated. Resp: Noted to be mildly tachypneic.  Diminished air entry at the bases.  Some of the tachypnea possibly due to agitation.  Lungs clear to auscultation anteriorly. Cardio: S1-S2 is tachycardic.  Irregular.  Telemetry shows heart rate in the 120s.  Possibly atrial fibrillation although rhythm is difficult to interpret. GI: Abdomen is soft.  Nontender.  Bowel sounds present.  No obvious masses organomegaly. Extremities: Covered in dressing Skin: Multiple skin tears and bruising noted all over his body. Neurologic: Agitated this morning.  Unable to move his upper extremities.  No obvious cranial nerve abnormalities noted.  Lab Results:  Data Reviewed: I have personally reviewed following labs and imaging studies  CBC: Recent Labs  Lab 06/21/18 0525 06/23/18 0620 06/24/18 0752  WBC 10.1 10.7* 10.2  NEUTROABS  --  7.9*  --   HGB 15.3 16.5 15.7  HCT 44.2 50.8 49.0  MCV 93.3 95.3 95.0  PLT 201 210 233    Basic Metabolic Panel: Recent Labs  Lab 06/21/18 0525 06/21/18 0727 06/22/18 0559 06/23/18 0620 06/24/18 0631 06/25/18 0900  NA 134*  --  141 141 140 135  K 4.6  --  3.3* 3.4* 4.8 5.1  CL 97*  --  106 101 103 102  CO2 23  --  27 25 21* 16*  GLUCOSE 176*  --  154* 168* 220* 310*  BUN 32*  --  31* 28* 40* 70*  CREATININE 2.14*  --  1.90* 2.11* 3.13* 4.71*  CALCIUM 10.0  --  9.9 10.1 9.8 9.5  MG  --  2.2  --  2.2  --   --   PHOS  --  2.9  --   --   --   --     GFR: Estimated Creatinine Clearance: 16 mL/min (A) (by C-G formula based on SCr of 4.71 mg/dL (H)).  Liver Function Tests: Recent Labs  Lab 06/21/18 0525 06/23/18 0620 06/24/18 0631  AST 45* 27 24  ALT 21 18 16   ALKPHOS 224* 194* 181*  BILITOT 2.2* 1.5* 1.3*  PROT 7.9 7.6 7.0  ALBUMIN 3.6 3.1* 2.9*     Recent Labs  Lab 06/21/18 0644 06/22/18 0835 06/24/18 1033 06/25/18 0900  AMMONIA 36* 11 39* 84*    Coagulation  Profile: Recent Labs  Lab 06/21/18 0727  INR 1.33    CBG: Recent Labs  Lab 06/24/18 1218 06/24/18 1731 06/24/18 2216 06/25/18 0741 06/25/18 1113  GLUCAP 238* 229* 260* 313* 292*      Recent Results (from the past 240 hour(s))  MRSA PCR Screening     Status: Abnormal   Collection Time: 06/21/18 10:21 AM  Result Value Ref Range Status   MRSA by PCR POSITIVE (A) NEGATIVE Final    Comment:        The GeneXpert MRSA Assay (FDA approved for NASAL specimens only), is one component of a comprehensive MRSA colonization surveillance program. It is not intended to diagnose MRSA infection nor to guide or monitor treatment for MRSA infections. RESULT CALLED TO, READ BACK BY AND VERIFIED WITH: BERENICE AJEJO AT 1154 ON 06/21/2018, KP Performed at [facility], [address], [place], [place] [address]   Urine culture     Status: Abnormal   Collection Time: 06/21/18  4:30 PM  Result Value Ref Range Status   Specimen Description   Final    URINE, RANDOM Performed at [facility], [address], [place], [place] [address]    Special Requests   Final    NONE Performed at [facility], [address], [place], [place] [address]    Culture (A)  Final    <10,000 COLONIES/mL INSIGNIFICANT GROWTH Performed at [facility], [address], [place], [place] [address]    Report Status 06/23/2018 FINAL  Final  Culture, blood (Routine X 2) w Reflex to ID Panel     Status: None (Preliminary result)   Collection Time: 06/23/18  6:20 AM  Result Value Ref Range Status   Specimen Description BLOOD LEFT ANTECUBITAL  Final   Special Requests   Final    BOTTLES DRAWN AEROBIC AND ANAEROBIC Blood Culture adequate volume   Culture   Final    NO GROWTH 1 DAY Performed at [facility], [address], [place], [place] [address]    Report Status PENDING  Incomplete  Culture, blood (Routine X 2) w Reflex to ID Panel     Status: Abnormal   Collection  Time: 06/23/18  6:27 AM  Result Value Ref Range Status   Specimen Description BLOOD LEFT ARM  Final   Special Requests   Final    BOTTLES DRAWN AEROBIC ONLY Blood Culture adequate volume   Culture  Setup Time   Final    GRAM POSITIVE COCCI IN CLUSTERS AEROBIC BOTTLE ONLY CRITICAL RESULT CALLED TO, READ BACK BY AND VERIFIED WITH: K.AMEND,PHARMD AT 0542 ON 06/25/18 BY G.MCADOO    Culture (A)  Final  STAPHYLOCOCCUS SPECIES (COAGULASE NEGATIVE) THE SIGNIFICANCE OF ISOLATING THIS ORGANISM FROM A SINGLE SET OF BLOOD CULTURES WHEN MULTIPLE SETS ARE DRAWN IS UNCERTAIN. PLEASE NOTIFY THE MICROBIOLOGY DEPARTMENT WITHIN ONE WEEK IF SPECIATION AND SENSITIVITIES ARE REQUIRED. Performed at [facility], [address], [place], [place] [address]    Report Status 06/25/2018 FINAL  Final  Blood Culture ID Panel (Reflexed)     Status: Abnormal   Collection Time: 06/23/18  6:27 AM  Result Value Ref Range Status   Enterococcus species NOT DETECTED NOT DETECTED Final   Listeria monocytogenes NOT DETECTED NOT DETECTED Final   Staphylococcus species DETECTED (A) NOT DETECTED Final    Comment: Methicillin (oxacillin) resistant coagulase negative staphylococcus. Possible blood culture contaminant (unless isolated from more than one blood culture draw or clinical case suggests pathogenicity). No antibiotic treatment is indicated for blood  culture contaminants. CRITICAL RESULT CALLED TO, READ BACK BY AND VERIFIED WITH: K.AMEND,PHARMD AT 0542 ON 06/25/18 BY G.MCADOO    Staphylococcus aureus (BCID) NOT DETECTED NOT DETECTED Final   Methicillin resistance DETECTED (A) NOT DETECTED Final    Comment: CRITICAL RESULT CALLED TO, READ BACK BY AND VERIFIED WITH: K.AMEND,PHARMD AT 0542 ON 06/25/18 BY G.MCADOO    Streptococcus species NOT DETECTED NOT DETECTED Final   Streptococcus agalactiae NOT DETECTED NOT DETECTED Final   Streptococcus pneumoniae NOT DETECTED NOT DETECTED Final   Streptococcus pyogenes NOT  DETECTED NOT DETECTED Final   Acinetobacter baumannii NOT DETECTED NOT DETECTED Final   Enterobacteriaceae species NOT DETECTED NOT DETECTED Final   Enterobacter cloacae complex NOT DETECTED NOT DETECTED Final   Escherichia coli NOT DETECTED NOT DETECTED Final   Klebsiella oxytoca NOT DETECTED NOT DETECTED Final   Klebsiella pneumoniae NOT DETECTED NOT DETECTED Final   Proteus species NOT DETECTED NOT DETECTED Final   Serratia marcescens NOT DETECTED NOT DETECTED Final   Haemophilus influenzae NOT DETECTED NOT DETECTED Final   Neisseria meningitidis NOT DETECTED NOT DETECTED Final   Pseudomonas aeruginosa NOT DETECTED NOT DETECTED Final   Candida albicans NOT DETECTED NOT DETECTED Final   Candida glabrata NOT DETECTED NOT DETECTED Final   Candida krusei NOT DETECTED NOT DETECTED Final   Candida parapsilosis NOT DETECTED NOT DETECTED Final   Candida tropicalis NOT DETECTED NOT DETECTED Final    Comment: Performed at [facility], [address], [place], [place] [address]      Radiology Studies: Mr Brain Wo Contrast  Result Date: 06/23/2018 CLINICAL DATA:  Stroke.  Bilateral arm weakness. EXAM: MRI HEAD WITHOUT CONTRAST TECHNIQUE: Multiplanar, multiecho pulse sequences of the brain and surrounding structures were obtained without intravenous contrast. COMPARISON:  CT head 06/21/2018 FINDINGS: Brain: MRI compatible pacemaker was monitored during the scan. No adverse effects. Moderate atrophy. Negative for acute infarct. Chronic hemorrhagic infarct right occipital lobe. Small chronic infarct right thalamus. Negative for mass or edema. Vascular: Normal arterial flow voids Skull and upper cervical spine: Negative Sinuses/Orbits: Bilateral cataract surgery.  Paranasal sinuses clear Other: None IMPRESSION: No acute abnormality Chronic hemorrhagic infarct right occipital lobe with generalized atrophy. Electronically Signed   By: Marlan Palau M.D.   On: 06/23/2018 16:00   Mr Cervical Spine  Wo Contrast  Result Date: 06/23/2018 CLINICAL DATA:  Recent fall. Bilateral upper extremity weakness. Myelopathy. EXAM: MRI CERVICAL SPINE WITHOUT CONTRAST TECHNIQUE: Multiplanar, multisequence MR imaging of the cervical spine was performed. No intravenous contrast was administered. COMPARISON:  Cervical spine CT 06/22/2018 FINDINGS: Multiple sequences are motion degraded with severe motion  on the axial gradient echo sequence and milder motion on the axial spin echo sequence. Significant artifact is also present on the sagittal STIR and sagittal gradient echo sequences. Alignment: Trace retrolisthesis of C3 on C4. Vertebrae: No fracture, suspicious osseous lesion, or significant marrow edema. Cord: Possible low level T2/STIR hyperintensity in the cord at C3-4. No gross cord hemorrhage. Posterior Fossa, vertebral arteries, paraspinal tissues: At most trace prevertebral fluid in the mid and lower cervical spine without other findings to clearly indicate ligamentous injury. At most minimal posterior soft tissue edema. Grossly preserved vertebral artery flow voids. Disc levels: Assessment of degenerative changes is limited by motion artifact on axial imaging. The cervical spinal canal is mildly small in caliber diffusely on a congenital basis. C2-3: Disc bulging, uncovertebral spurring, and mild facet arthrosis result in mild spinal stenosis and mild bilateral neural foraminal stenosis. C3-4: Disc bulging, broad posterior disc protrusion (partially calcified on CT), mild infolding of the ligamentum flavum, and mild facet arthrosis result in severe spinal stenosis with moderate cord flattening and moderate to severe right and severe left neural foraminal stenosis. C4-5: Disc bulging, uncovertebral spurring, and mild facet arthrosis result in moderate spinal stenosis and mild right and moderate left neural foraminal stenosis. C5-6: Mild disc bulging, small central disc protrusion, and minimal uncovertebral spurring  result in mild spinal stenosis and mild bilateral neural foraminal stenosis. C6-7: Broad-based posterior disc osteophyte complex including a large right paracentral and foraminal component results in mild spinal stenosis with slight right ventral cord flattening and severe right and mild left neural foraminal stenosis. C7-T1: Negative. IMPRESSION: 1. Motion degraded examination without definite acute osseous abnormality or ligamentous injury identified. 2. 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 3. 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 4. Moderate spinal stenosis at C4-5 and milder spinal stenosis at the other levels. 5. Severe multilevel neural foraminal stenosis. Electronically Signed   By: Sebastian Ache M.D.   On: 06/23/2018 16:13     Medications:  Scheduled: . bacitracin   Topical BID  . Chlorhexidine Gluconate Cloth  6 each Topical Q0600  . dexamethasone  10 mg Intravenous Q6H  . haloperidol lactate  2 mg Intravenous Once  . heparin injection (subcutaneous)  5,000 Units Subcutaneous Q8H  . insulin aspart  0-9 Units Subcutaneous TID WC  . insulin glargine  15 Units Subcutaneous QHS  . lactulose  300 mL Rectal BID  . levothyroxine  175 mcg Oral QAC breakfast  . metoprolol tartrate  2.5 mg Intravenous Once  . metoprolol tartrate  25 mg Oral BID  . mupirocin ointment  1 application Nasal BID  . nystatin  1 g Topical BID   Continuous: . sodium chloride 100 mL/hr at 06/25/18 0146  . sodium chloride 250 mL (06/25/18 0623)  . 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  He underwent CT scan of the head and cervical spine which did not show any thing acute.  MRI could not be done there as the patient has a pacemaker.  He was transferred here.  Found to have significant cervical spine stenosis with cord flattening and cord edema noted on MRI.  Neurosurgery  was consulted.  Patient started on dexamethasone.  He will most likely need surgical intervention to correct this.  Neurosurgery was planning this sometime next week allowing the steroids time to decrease the edema.  Family wanted patient to be transferred to [facility].  Discussed with neurosurgery there, Dr. Cassell Smiles, who cannot accommodate this patient in their surgical schedule this week so they recommend that the patient stay in this hospital.  Family then requested that I call [facility] which I did.  Spoke to the neurosurgeon there, Dr. Liliane Bade, who also said the same.  This was communicated to the family.  Discussed with Dr. Jordan Likes this morning.  Tentative plan for surgery sometime this week.  He will be high risk for surgery due to his cardiac disease and worsening renal function.  Acute metabolic encephalopathy likely hepatic encephalopathy This was noted at the other facility.  He was found to have minimally elevated ammonia level and was placed on lactulose.  MRI brain done did not show any acute stroke.  Chronic infarct was noted.  Patient noted to be quite agitated this morning.  Patient's wife tells me that he was recently diagnosed as having vascular dementia and has been noted to have cognitive deficits.  So his altered mental status is thought to be multifactorial including hepatic encephalopathy considering elevated lactulose level, new environment, history of dementia, steroids.  We will give him Haldol this morning to try and calm down.  Ammonia level noted to be quite elevated this morning.  Unsure if he has been able to take his lactulose orally.  We will give him  lactulose enemas.  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  However this was discussed with his wife.  She tells me that there is no diagnosis offered for his liver condition.  He underwent MRI of his hepatobiliary system recently which did not even show findings of liver cirrhosis.  Hepatic congestion was noted which was thought to be due to his diastolic CHF.  Unknown if he has been evaluated by gastroenterology.  His LFTs are normal.  His bilirubin is 1.3.  Does not need GI input at this time.  Continue to follow labs.  He can pursue this further in the outpatient setting.  Noted to be on ceftriaxone for reasons are unclear, presumably for SBP prophylaxis. Was previously on Bactrim.  His abdomen is benign making the likelihood of SBP low.  Coagulase-negative staph aureus bacteremia This is in 1 out of 4 sets.  Most likely a contaminant.  Diabetes mellitus type II CBG is elevated due to steroids.  We will increase the dose of Lantus.  Acute on chronic kidney disease stage III/Hypokalemia Baseline creatinine appears to be around 1.5.  Creatinine continues to rise.  Etiology of renal failure is not entirely clear.  Due to poor oral intake the last many days this was thought to be due to hypovolemia.  Patient however also has a history of CHF.  Of note patient did get contrast on 10/3 when he had a CT scan of his chest.  So this could be contrast induced nephropathy.  Patient was also on Bactrim at the outside facility.  Creatinine has climbed to 4.71.  We will consult nephrology to assist with management.  Renal ultrasound will be ordered.    Atrial fibrillation with RVR Patient was on metoprolol and Eliquis as an outpatient.  Due to his encephalopathy he is not been able to take his  medications consistently.  His heart rate is noted to be elevated this morning.  EKG shows possibly A. fib with RVR.  We will place him on intravenous  metoprolol.  Agitation also contributing to elevated heart rate.  Eliquis is on hold as he will need cervical spine surgery.  His chads 2 vascular score is 7.  Chronic combined systolic and diastolic CHF Echocardiogram from August 2019 showed a EF of 35%.  Patient was on diuretics which are on hold due to worsening renal function.  His weight was recorded as 102.1 kg on 10/3.  Previous records reviewed.  His weight was 104.6 kg on 9/8.  It has been very hard to assess his volume status.  His weight appears to be stable for the most part.  Since he will need cervical spine surgery we will go ahead and consult cardiology to assist with management.  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  Previous history of stroke Stable.  Noted to be on statin.  Has been on anticoagulation which is on hold for surgery.  Hypothyroidism Continue home medications.  TSH was 1.1 on 10/2.  History of gout Continue allopurinol.  Multiple skin lesions abrasions and skin tears He has been seen by wound care.  Abnormal appearing urine Moderate hemoglobin trace leukocytes 11-20 RBC 11-20 WBC noted.  Urine culture did not show any significant growth.  Unlikely the patient has clinically relevant urinary tract infection.  Abnormal finding on CT-head 06/21/18 CT-head showed a 1 cm lytic lesion RIGHT frontal calvarium. This could reflect atypical hemangioma per radiologist, though metastatic disease not excluded. Of note, pt was found to have LEFT periauricular metallic foreign bodies on CT-scan, MRI may be nondiagnostic though not contraindicated per radiologist. -f/u to repeat CT-head as outpt with PCP  DVT Prophylaxis: Subcutaneous heparin    Code Status: DNR Family Communication: Discussed with his wife at the bedside. Disposition Plan: Management as outlined above.  Nephrology consulted.    LOS: 2 days   Osvaldo Shipper  [facility] Pager [phone] 06/25/2018, 11:30  AM  If 7PM-7AM, please contact night-coverage at [email or web address], password [facility]

## 2018-06-25 NOTE — Consult Note (Addendum)
Referring Physician: [name] is an 77 y.o. male.                       Chief Complaint: Atrial fibrillation  HPI: 77 year old male with PMH of chronic systolic left heart failure, atrial fibrillation, [employer] PPM, stroke, hypertension, type 2 DM. Hypertension, hyperlipidemia, hypothyroidism, gout, acute on chronic kidney injury and recent hepatic encephalopathy has altered mental status with mild tachycardia. He has cervical spine stenosis with radiculopathy and is awaiting C-spine fusion with cord decompression in few days and Apixaban is on hold. C-spine MRI is positive for Severe spinal stenosis at C3-4 and severe multilevel neural foraminal stenoses.  Past Medical History:  Diagnosis Date  . Cardiac pacemaker   . CKD (chronic kidney disease)   . Diabetes mellitus without complication ([place])   . Encephalopathy   . Gout   . Heart disease       Past Surgical History:  Procedure Laterality Date  . APPENDECTOMY    . CARDIOVERSION    . PACEMAKER PLACEMENT    . TONSILLECTOMY    . TOTAL HIP ARTHROPLASTY Right     No family history on file. Social History:  reports that he has never smoked. He has never used smokeless tobacco. He reports that he drinks alcohol. He reports that he does not use drugs.  Allergies:  Allergies  Allergen Reactions  . Amiodarone   . Warfarin And Related     Medications Prior to Admission  Medication Sig Dispense Refill  . acetaminophen (TYLENOL) 325 MG tablet Take 650 mg by mouth every 4 (four) hours as needed.    Marland Kitchen allopurinol (ZYLOPRIM) 300 MG tablet Take 300 mg by mouth daily.    Marland Kitchen apixaban (ELIQUIS) 2.5 MG TABS tablet Take 2.5 mg by mouth 2 (two) times daily.    . bumetanide (BUMEX) 1 MG tablet Take 1 mg by mouth daily as needed (edema).     . bumetanide (BUMEX) 2 MG tablet Take 2 mg by mouth daily.    Marland Kitchen docusate sodium (COLACE) 100 MG capsule Take 100 mg by mouth 2 (two) times daily.    Marland Kitchen  glucosamine-chondroitin 500-400 MG tablet Take 1 tablet by mouth 2 (two) times daily.    . insulin aspart (NOVOLOG) 100 UNIT/ML injection Inject 10 Units into the skin 3 (three) times daily with meals.    . insulin glargine (LANTUS) 100 UNIT/ML injection Inject 18 Units into the skin at bedtime.    Marland Kitchen lactulose (CHRONULAC) 10 GM/15ML solution Take 45 mLs (30 g total) by mouth 2 (two) times daily. 240 mL 0  . levothyroxine (SYNTHROID, LEVOTHROID) 175 MCG tablet Take 175 mcg by mouth daily before breakfast.    . Melatonin 5 MG TABS Take 1 tablet by mouth at bedtime as needed.    . metoprolol tartrate (LOPRESSOR) 25 MG tablet Take 25 mg by mouth 2 (two) times daily.    . Multiple Vitamins-Minerals (CERTAVITE SENIOR/ANTIOXIDANT) TABS Take 1 tablet by mouth daily.    . mupirocin ointment (BACTROBAN) 2 % Apply 1 application topically daily. Apply to Affected area once daily  0  . nystatin (NYSTATIN) powder Apply 1 g topically 2 (two) times daily.    . polyethylene glycol (MIRALAX / GLYCOLAX) packet Take 17 g by mouth daily.    . pravastatin (PRAVACHOL) 10 MG tablet Take 10 mg by mouth at bedtime.    . senna (SENOKOT) 8.6 MG tablet Take 1  tablet by mouth 2 (two) times daily.    Marland Kitchen spironolactone (ALDACTONE) 25 MG tablet Take 25 mg by mouth daily.    Marland Kitchen sulfamethoxazole-trimethoprim (BACTRIM DS,SEPTRA DS) 800-160 MG tablet Take 1 tablet by mouth 2 (two) times daily.      Results for orders placed or performed during the hospital encounter of 06/23/18 (from the past 48 hour(s))  Glucose, capillary     Status: Abnormal   Collection Time: 06/23/18  4:49 PM  Result Value Ref Range   Glucose-Capillary 178 (H) 70 - 99 mg/dL  Glucose, capillary     Status: Abnormal   Collection Time: 06/23/18  9:35 PM  Result Value Ref Range   Glucose-Capillary 160 (H) 70 - 99 mg/dL  Hemoglobin A1c     Status: Abnormal   Collection Time: 06/24/18  6:31 AM  Result Value Ref Range   Hgb A1c MFr Bld 6.7 (H) 4.8 - 5.6 %     Comment: (NOTE)         Prediabetes: 5.7 - 6.4         Diabetes: >6.4         Glycemic control for adults with diabetes: <7.0    Mean Plasma Glucose 146 mg/dL    Comment: (NOTE) Performed At: [facility] [place], [place] [id number] Rush Farmer MD ER:[id number]   Comprehensive metabolic panel     Status: Abnormal   Collection Time: 06/24/18  6:31 AM  Result Value Ref Range   Sodium 140 135 - 145 mmol/L   Potassium 4.8 3.5 - 5.1 mmol/L    Comment: DELTA CHECK NOTED RESULT CALLED TO, READ BACK BY AND VERIFIED WITH: J STEVENS,RN AT 5631 06/24/18 BY L BENFIELD    Chloride 103 98 - 111 mmol/L   CO2 21 (L) 22 - 32 mmol/L   Glucose, Bld 220 (H) 70 - 99 mg/dL   BUN 40 (H) 8 - 23 mg/dL   Creatinine, Ser 3.13 (H) 0.61 - 1.24 mg/dL    Comment: DELTA CHECK NOTED   Calcium 9.8 8.9 - 10.3 mg/dL   Total Protein 7.0 6.5 - 8.1 g/dL   Albumin 2.9 (L) 3.5 - 5.0 g/dL   AST 24 15 - 41 U/L   ALT 16 0 - 44 U/L   Alkaline Phosphatase 181 (H) 38 - 126 U/L   Total Bilirubin 1.3 (H) 0.3 - 1.2 mg/dL   GFR calc non Af Amer 18 (L) >60 mL/min   GFR calc Af Amer 21 (L) >60 mL/min    Comment: (NOTE) The eGFR has been calculated using the CKD EPI equation. This calculation has not been validated in all clinical situations. eGFR's persistently <60 mL/min signify possible Chronic Kidney Disease.    Anion gap 16 (H) 5 - 15    Comment: Performed at [facility], [place] [address], [place], [place] [address]  Glucose, capillary     Status: Abnormal   Collection Time: 06/24/18  7:38 AM  Result Value Ref Range   Glucose-Capillary 232 (H) 70 - 99 mg/dL  CBC     Status: Abnormal   Collection Time: 06/24/18  7:52 AM  Result Value Ref Range   WBC 10.2 4.0 - 10.5 K/uL   RBC 5.16 4.22 - 5.81 MIL/uL   Hemoglobin 15.7 13.0 - 17.0 g/dL   HCT 49.0 39.0 - 52.0 %   MCV 95.0 78.0 - 100.0 fL   MCH 30.4 26.0 - 34.0 pg   MCHC 32.0 30.0 - 36.0  g/dL   RDW 16.7 (H) 11.5 - 15.5 %   Platelets  233 150 - 400 K/uL    Comment: Performed at [facility], [place] [address] [place], [place] [address]  Ammonia     Status: Abnormal   Collection Time: 06/24/18 10:33 AM  Result Value Ref Range   Ammonia 39 (H) 9 - 35 umol/L    Comment: Performed at [facility], [place] [address] [place], [place] [address]  Glucose, capillary     Status: Abnormal   Collection Time: 06/24/18 12:18 PM  Result Value Ref Range   Glucose-Capillary 238 (H) 70 - 99 mg/dL  Glucose, capillary     Status: Abnormal   Collection Time: 06/24/18  5:31 PM  Result Value Ref Range   Glucose-Capillary 229 (H) 70 - 99 mg/dL  Glucose, capillary     Status: Abnormal   Collection Time: 06/24/18 10:16 PM  Result Value Ref Range   Glucose-Capillary 260 (H) 70 - 99 mg/dL  Glucose, capillary     Status: Abnormal   Collection Time: 06/25/18  7:41 AM  Result Value Ref Range   Glucose-Capillary 313 (H) 70 - 99 mg/dL   Comment 1 Notify RN    Comment 2 Document in Chart   Basic metabolic panel     Status: Abnormal   Collection Time: 06/25/18  9:00 AM  Result Value Ref Range   Sodium 135 135 - 145 mmol/L   Potassium 5.1 3.5 - 5.1 mmol/L   Chloride 102 98 - 111 mmol/L   CO2 16 (L) 22 - 32 mmol/L   Glucose, Bld 310 (H) 70 - 99 mg/dL   BUN 70 (H) 8 - 23 mg/dL   Creatinine, Ser 4.71 (H) 0.61 - 1.24 mg/dL    Comment: DELTA CHECK NOTED   Calcium 9.5 8.9 - 10.3 mg/dL   GFR calc non Af Amer 11 (L) >60 mL/min   GFR calc Af Amer 13 (L) >60 mL/min    Comment: (NOTE) The eGFR has been calculated using the CKD EPI equation. This calculation has not been validated in all clinical situations. eGFR's persistently <60 mL/min signify possible Chronic Kidney Disease.    Anion gap 17 (H) 5 - 15    Comment: Performed at [facility], [place] [address], [place], [place] [address]  Ammonia     Status: Abnormal   Collection Time: 06/25/18  9:00 AM  Result Value Ref Range   Ammonia 84 (H) 9 - 35 umol/L    Comment: Performed at  [facility], [place] [address], [place], [place] [address]  Glucose, capillary     Status: Abnormal   Collection Time: 06/25/18 11:13 AM  Result Value Ref Range   Glucose-Capillary 292 (H) 70 - 99 mg/dL   Comment 1 Notify RN    Comment 2 Document in Chart    Mr Brain Wo Contrast  Result Date: 06/23/2018 CLINICAL DATA:  Stroke.  Bilateral arm weakness. EXAM: MRI HEAD WITHOUT CONTRAST TECHNIQUE: Multiplanar, multiecho pulse sequences of the brain and surrounding structures were obtained without intravenous contrast. COMPARISON:  CT head 06/21/2018 FINDINGS: Brain: MRI compatible pacemaker was monitored during the scan. No adverse effects. Moderate atrophy. Negative for acute infarct. Chronic hemorrhagic infarct right occipital lobe. Small chronic infarct right thalamus. Negative for mass or edema. Vascular: Normal arterial flow voids Skull and upper cervical spine: Negative Sinuses/Orbits: Bilateral cataract surgery.  Paranasal sinuses clear Other: None IMPRESSION: No acute abnormality Chronic hemorrhagic  infarct right occipital lobe with generalized atrophy. Electronically Signed   By: Franchot Gallo M.D.   On: 06/23/2018 16:00   Mr Cervical Spine Wo Contrast  Result Date: 06/23/2018 CLINICAL DATA:  Recent fall. Bilateral upper extremity weakness. Myelopathy. EXAM: MRI CERVICAL SPINE WITHOUT CONTRAST TECHNIQUE: Multiplanar, multisequence MR imaging of the cervical spine was performed. No intravenous contrast was administered. COMPARISON:  Cervical spine CT 06/22/2018 FINDINGS: Multiple sequences are motion degraded with severe motion on the axial gradient echo sequence and milder motion on the axial spin echo sequence. Significant artifact is also present on the sagittal STIR and sagittal gradient echo sequences. Alignment: Trace retrolisthesis of C3 on C4. Vertebrae: No fracture, suspicious osseous lesion, or significant marrow edema. Cord: Possible low level T2/STIR hyperintensity in the cord at  C3-4. No gross cord hemorrhage. Posterior Fossa, vertebral arteries, paraspinal tissues: At most trace prevertebral fluid in the mid and lower cervical spine without other findings to clearly indicate ligamentous injury. At most minimal posterior soft tissue edema. Grossly preserved vertebral artery flow voids. Disc levels: Assessment of degenerative changes is limited by motion artifact on axial imaging. The cervical spinal canal is mildly small in caliber diffusely on a congenital basis. C2-3: Disc bulging, uncovertebral spurring, and mild facet arthrosis result in mild spinal stenosis and mild bilateral neural foraminal stenosis. C3-4: Disc bulging, broad posterior disc protrusion (partially calcified on CT), mild infolding of the ligamentum flavum, and mild facet arthrosis result in severe spinal stenosis with moderate cord flattening and moderate to severe right and severe left neural foraminal stenosis. C4-5: Disc bulging, uncovertebral spurring, and mild facet arthrosis result in moderate spinal stenosis and mild right and moderate left neural foraminal stenosis. C5-6: Mild disc bulging, small central disc protrusion, and minimal uncovertebral spurring result in mild spinal stenosis and mild bilateral neural foraminal stenosis. C6-7: Broad-based posterior disc osteophyte complex including a large right paracentral and foraminal component results in mild spinal stenosis with slight right ventral cord flattening and severe right and mild left neural foraminal stenosis. C7-T1: Negative. IMPRESSION: 1. Motion degraded examination without definite acute osseous abnormality or ligamentous injury identified. 2. 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 3. 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 4. Moderate spinal stenosis at C4-5 and milder spinal stenosis at the other levels. 5. Severe multilevel neural foraminal stenosis. Electronically  Signed   By: Logan Bores M.D.   On: 06/23/2018 16:13    Review Of Systems Constitutional: No fever, chills, weight loss or gain. Eyes: No vision change, wears glasses. No discharge or pain. Ears: No hearing loss, No tinnitus. Respiratory: No asthma, COPD, pneumonias. Positive shortness of breath. No hemoptysis. Cardiovascular: Positive chest pain, palpitation, leg edema. Gastrointestinal: No nausea, vomiting, diarrhea, constipation. No GI bleed. No hepatitis. Genitourinary: No dysuria, hematuria, kidney stone. No incontinance. Neurological: No headache, stroke, seizures.  Psychiatry: No psych facility admission for anxiety, depression, suicide. No detox. Skin: No rash. Musculoskeletal: Positive joint pain, no fibromyalgia. No neck pain, back pain. Lymphadenopathy: No lymphadenopathy. Hematology: No anemia or easy bruising.   Blood pressure (!) 121/93, pulse (!) 121, temperature 97.9 F (36.6 C), temperature source Oral, resp. rate 18, height 5' 10"  (1.778 m), weight 102.1 kg, SpO2 91 %. Body mass index is 32.28 kg/m. General appearance: alert, cooperative, appears stated age and mild respiratory distress Head: Normocephalic. Multiple facial bruises and abrasions. Eyes: Blue eyes, pink conjunctiva, corneas clear. PERRL, EOM's intact. Neck: No adenopathy, no carotid  bruit, no JVD, supple, symmetrical, trachea midline and thyroid not enlarged. Resp: Clear to auscultation bilaterally. Left pectoral pacer. Cardio: Regular rate and rhythm, S1, S2 normal, II/VI systolic murmur, no click, rub or gallop GI: Soft, non-tender; bowel sounds normal; no organomegaly. Extremities: No edema, cyanosis or clubbing. Skin: Warm and dry.  Neurologic: Alert and oriented X 1, Decreased bilateral UE strength moderate to severe and mild bilateral LE strength.   Assessment/Plan Atrial fibrillation with moderate ventricular response, [id number] score of 7 Altered mental status Acute on chronic renal  failure, contrast and diuretic induced Chronic systolic left heart failure [employer] PPM PVD CAD Type 2 DM Old stroke Hypertension Hyperlipidemia Hyperthyroidism Gout  Agree with echocardiogram and renal consult Resume anticoagulation post surgery. B-blocker as tolerated. Will add lanoxin or amiodarone for heart rate control if needed.  Birdie Riddle, MD  06/25/2018, 12:20 PM

## 2018-06-25 NOTE — Progress Notes (Signed)
RN helped patient drink during med passes and eat ice in between, patient still has very little output. Bladder scan done= <62mL

## 2018-06-26 DIAGNOSIS — K7290 Hepatic failure, unspecified without coma: Secondary | ICD-10-CM

## 2018-06-26 DIAGNOSIS — R0600 Dyspnea, unspecified: Secondary | ICD-10-CM

## 2018-06-26 DIAGNOSIS — Z515 Encounter for palliative care: Secondary | ICD-10-CM

## 2018-06-26 DIAGNOSIS — R0609 Other forms of dyspnea: Secondary | ICD-10-CM

## 2018-06-26 DIAGNOSIS — N179 Acute kidney failure, unspecified: Secondary | ICD-10-CM

## 2018-06-26 LAB — RENAL FUNCTION PANEL
ALBUMIN: 3.2 g/dL — AB (ref 3.5–5.0)
ANION GAP: 17 — AB (ref 5–15)
BUN: 93 mg/dL — ABNORMAL HIGH (ref 8–23)
CALCIUM: 9.5 mg/dL (ref 8.9–10.3)
CO2: 18 mmol/L — ABNORMAL LOW (ref 22–32)
Chloride: 101 mmol/L (ref 98–111)
Creatinine, Ser: 5.63 mg/dL — ABNORMAL HIGH (ref 0.61–1.24)
GFR, EST AFRICAN AMERICAN: 10 mL/min — AB (ref >60)
GFR, EST NON AFRICAN AMERICAN: 9 mL/min — AB (ref >60)
GLUCOSE: 296 mg/dL — AB (ref 70–99)
PHOSPHORUS: 5.9 mg/dL — AB (ref 2.5–4.6)
Potassium: 5.2 mmol/L — ABNORMAL HIGH (ref 3.5–5.1)
SODIUM: 136 mmol/L (ref 135–145)

## 2018-06-26 LAB — CBC
HCT: 49 % (ref 39.0–52.0)
HEMOGLOBIN: 16 g/dL (ref 13.0–17.0)
MCH: 30.5 pg (ref 26.0–34.0)
MCHC: 32.7 g/dL (ref 30.0–36.0)
MCV: 93.5 fL (ref 78.0–100.0)
Platelets: 268 10*3/uL (ref 150–400)
RBC: 5.24 MIL/uL (ref 4.22–5.81)
RDW: 17 % — ABNORMAL HIGH (ref 11.5–15.5)
WBC: 9.7 10*3/uL (ref 4.0–10.5)

## 2018-06-26 LAB — GLUCOSE, CAPILLARY: GLUCOSE-CAPILLARY: 292 mg/dL — AB (ref 70–99)

## 2018-06-26 MED ORDER — FENTANYL CITRATE (PF) 100 MCG/2ML IJ SOLN: 25.0000 ug | INTRAMUSCULAR | 0 refills | Status: AC | PRN

## 2018-06-26 MED ORDER — FENTANYL CITRATE (PF) 100 MCG/2ML IJ SOLN: 25.0000 ug | INTRAMUSCULAR | Status: DC | PRN

## 2018-06-26 MED ORDER — HALOPERIDOL LACTATE 5 MG/ML IJ SOLN: 2.0000 mg | Freq: Four times a day (QID) | INTRAMUSCULAR | Status: DC | PRN

## 2018-06-26 MED ORDER — SODIUM POLYSTYRENE SULFONATE 15 GM/60ML PO SUSP
30.0000 g | Freq: Once | ORAL | Status: DC
  Filled 2018-06-26: qty 120

## 2018-06-26 MED ORDER — METOPROLOL TARTRATE 50 MG PO TABS: 50.0000 mg | ORAL_TABLET | Freq: Two times a day (BID) | ORAL | Status: AC

## 2018-06-26 MED ORDER — HALOPERIDOL LACTATE 5 MG/ML IJ SOLN: 2.0000 mg | Freq: Four times a day (QID) | INTRAMUSCULAR | Status: AC | PRN

## 2018-06-26 NOTE — Progress Notes (Addendum)
  Speech Language Pathology Treatment: Dysphagia  Patient Details Name: Leonard Welch MRN: [id number] DOB: 02-18-1941 Today's Date: 06/26/2018 Time: 4098-1191 SLP Time Calculation (min) (ACUTE ONLY): 24 min  Assessment / Plan / Recommendation Clinical Impression  Pt presented with wet/gurgly vocal quality and erucation following deglutitaion of ice chips and thin, suspicious for aspiration. Per pt's request, we did not pursue further PO trials. He demonstrated fatigue, labored breathing, and decreased appetite, requiring mod-max verbal and tactile cues to participate. SLP educated pt and family regarding aspiration precautions and options for PO intake related to quality of life. His wife acknowledged risks of aspiration and voiced desire to pursue comfort feeds of pt's preferred POs when he expresses hunger and no instrumental swallow assessment at this time. Given pt/family wishes, recommend regular diet and thin liquids so pt can order softer textures when desired. Administer meds crushed in puree, take slow/small bites and sips, and sit upright during all meals. Please provide thorough oral care before and after PO intake. ST will continue to provide education regarding safe positioning and precautions.    HPI HPI: Pt is a 77 y.o. 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 He was initially admitted to [facility] hospital 10/2 with AMS. Pt was treated for suspected hepatic encephalopathy with lactulose.  Mental status improved, but pt developed BUE weakness. CT head, cervical spine, and angiogram of chest were unrevealing (although did note osteophytes C4-5 and C5-6). Pt was transferred to [facility] for further w/u including MRI of cervical spine. Pt was noted to be coughing with PO intake 10/4, therefore SLP was ordered.      SLP Plan  Continue with  current plan of care       Recommendations  Diet recommendations: Regular;Thin liquid Liquids provided via: Cup;Teaspoon Medication Administration: Crushed with puree Supervision: Staff to assist with self feeding;Full supervision/cueing for compensatory strategies Compensations: Minimize environmental distractions;Slow rate;Small sips/bites;Lingual sweep for clearance of pocketing Postural Changes and/or Swallow Maneuvers: Seated upright 90 degrees;Upright 30-60 min after meal                Oral Care Recommendations: Oral care BID;Oral care before and after PO Follow up Recommendations: (TBD) SLP Visit Diagnosis: Dysphagia, unspecified (R13.10) Plan: Continue with current plan of care       [name], Student SLP                 [name] 06/26/2018, 10:58 AM

## 2018-06-26 NOTE — Progress Notes (Signed)
[facility] PROGRESS NOTE  Leonard Welch WUJ:[id number] DOB: [birth year] DOA: 06/23/2018  PCP: Barnett Hatter, MD  Brief History/Interval Summary: 77 y.o. 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 Pt was initially admitted to [facility] hospital on 06/21/18 due to altered mental status. Pt has been treated for suspected hepatic encephalopathy with lactulose.  Mental status has improved, but was found to have new onset letter upper extremity weakness. Neurology recommended CT head, cervical spine, and angiogram of chest which were unrevealing. MRI of cervical spine recommended to assess for cord contusion, however unable to perform at [place] due to PPM.   Reason for Visit: Bilateral upper extremity weakness  Consultants: Neurology.  Neurosurgery.  Nephrology .  Cardiology: Dr. Algie Coffer.  Palliative medicine  Procedures:  Transthoracic echocardiogram Study Conclusions  - Left ventricle: The cavity size was normal. There was moderate   concentric hypertrophy. Systolic function was moderately reduced.   The estimated ejection fraction was in the range of 35% to 40%.   There is moderate hypokinesis of the entire myocardium. The study   is not technically sufficient to allow evaluation of LV diastolic   function. - Mitral valve: There was mild regurgitation. - Left atrium: The atrium was mildly dilated. - Right ventricle: The cavity size was mildly dilated. Wall   thickness was normal. Systolic function was moderately to   severely reduced. - Right atrium: The atrium was moderately dilated. - Tricuspid valve: There was moderate regurgitation. - Pulmonary arteries: Systolic pressure was moderately to severely   increased. PA peak pressure:  72 mm Hg (S).  Antibiotics: Ceftriaxone  Subjective/Interval History: Patient noted to be confused.  Somewhat lethargic this morning.  Wife is at the bedside.     ROS: Unable to do due to his encephalopathy  Objective:  Vital Signs  Vitals:   06/25/18 1222 06/25/18 1955 06/25/18 2106 06/26/18 0500  BP: 113/87 (!) 138/92 132/89 (!) 132/92  Pulse: (!) 106 (!) 112 62 97  Resp:  18 18 18   Temp:  97.8 F (36.6 C) (!) 97.5 F (36.4 C) (!) 97.5 F (36.4 C)  TempSrc:  Oral Oral Oral  SpO2: 92% 92% 92% 94%  Weight:    101.9 kg  Height:        Intake/Output Summary (Last 24 hours) at 06/26/2018 1103 Last data filed at 06/26/2018 1000 Gross per 24 hour  Intake 3381.17 ml  Output 2.5 ml  Net 3378.67 ml   Filed Weights   06/24/18 0550 06/25/18 0600 06/26/18 0500  Weight: 101.9 kg 102.1 kg 101.9 kg    General appearance: Awake alert.  Confused. Resp: Noted to be tachypneic.  Use of accessory muscle noted.  Diminished air entry at the bases.  No obvious wheezing rales or rhonchi appreciated on examination.   Cardio: Telemetry showed heart rate has improved.  Still with occasional brief periods of tachycardia.  Remains in atrial fibrillation.  S1-S2 is irregular.   GI: Abdomen is soft.  Nontender nondistended. Extremities: Lower extremities are covered in dressing Skin: Multiple skin tears and bruising noted all over his body. Neurologic: Noted to be distracted lethargic this morning.  Still unable to move his upper extremities.     Lab Results:  Data Reviewed: I have personally reviewed following labs and imaging studies  CBC: Recent  Labs  Lab 06/21/18 0525 06/23/18 0620 06/24/18 0752 06/26/18 0539  WBC 10.1 10.7* 10.2 9.7  NEUTROABS  --  7.9*  --   --   HGB 15.3 16.5 15.7 16.0  HCT 44.2 50.8 49.0 49.0  MCV 93.3 95.3 95.0 93.5  PLT 201 210 233 268    Basic Metabolic Panel: Recent Labs  Lab 06/21/18 0727 06/22/18 0559 06/23/18 0620 06/24/18 0631 06/25/18 0900  06/26/18 0539  NA  --  141 141 140 135 136  K  --  3.3* 3.4* 4.8 5.1 5.2*  CL  --  106 101 103 102 101  CO2  --  27 25 21* 16* 18*  GLUCOSE  --  154* 168* 220* 310* 296*  BUN  --  31* 28* 40* 70* 93*  CREATININE  --  1.90* 2.11* 3.13* 4.71* 5.63*  CALCIUM  --  9.9 10.1 9.8 9.5 9.5  MG 2.2  --  2.2  --   --   --   PHOS 2.9  --   --   --   --  5.9*    GFR: Estimated Creatinine Clearance: 13.4 mL/min (A) (by C-G formula based on SCr of 5.63 mg/dL (H)).  Liver Function Tests: Recent Labs  Lab 06/21/18 0525 06/23/18 0620 06/24/18 0631 06/26/18 0539  AST 45* 27 24  --   ALT 21 18 16   --   ALKPHOS 224* 194* 181*  --   BILITOT 2.2* 1.5* 1.3*  --   PROT 7.9 7.6 7.0  --   ALBUMIN 3.6 3.1* 2.9* 3.2*     Recent Labs  Lab 06/21/18 0644 06/22/18 0835 06/24/18 1033 06/25/18 0900  AMMONIA 36* 11 39* 84*    Coagulation Profile: Recent Labs  Lab 06/21/18 0727  INR 1.33    CBG: Recent Labs  Lab 06/25/18 0741 06/25/18 1113 06/25/18 1747 06/25/18 2048 06/26/18 0747  GLUCAP 313* 292* 223* 235* 292*      Recent Results (from the past 240 hour(s))  MRSA PCR Screening     Status: Abnormal   Collection Time: 06/21/18 10:21 AM  Result Value Ref Range Status   MRSA by PCR POSITIVE (A) NEGATIVE Final    Comment:        The GeneXpert MRSA Assay (FDA approved for NASAL specimens only), is one component of a comprehensive MRSA colonization surveillance program. It is not intended to diagnose MRSA infection nor to guide or monitor treatment for MRSA infections. RESULT CALLED TO, READ BACK BY AND VERIFIED WITH: BERENICE AJEJO AT 1154 ON 06/21/2018, KP Performed at [facility], [address], [place], [place] [address]   Urine culture     Status: Abnormal   Collection Time: 06/21/18  4:30 PM  Result Value Ref Range Status   Specimen Description   Final    URINE, RANDOM Performed at [facility], [address], [place], [place] [address]     Special Requests   Final    NONE Performed at [facility], [address] [place], [place] [address]    Culture (A)  Final    <10,000 COLONIES/mL INSIGNIFICANT GROWTH Performed at [facility], [address] [place], [place] [address]    Report Status 06/23/2018 FINAL  Final  Culture, blood (Routine X 2) w Reflex to ID Panel     Status: None (Preliminary result)   Collection Time: 06/23/18  6:20 AM  Result Value Ref Range Status   Specimen Description BLOOD LEFT ANTECUBITAL  Final   Special Requests   Final    BOTTLES DRAWN AEROBIC AND ANAEROBIC Blood Culture adequate volume   Culture   Final    NO GROWTH 3 DAYS Performed at [facility], [address] [place], [place] [address]    Report Status PENDING  Incomplete  Culture, blood (Routine X 2) w Reflex to ID Panel     Status: Abnormal   Collection Time: 06/23/18  6:27 AM  Result Value Ref Range Status   Specimen Description BLOOD LEFT ARM  Final   Special Requests   Final    BOTTLES DRAWN AEROBIC ONLY Blood Culture adequate volume   Culture  Setup Time   Final    GRAM POSITIVE COCCI IN CLUSTERS AEROBIC BOTTLE ONLY CRITICAL RESULT CALLED TO, READ BACK BY AND VERIFIED WITH: K.AMEND,PHARMD AT 0542 ON 06/25/18 BY G.MCADOO    Culture (A)  Final    STAPHYLOCOCCUS SPECIES (COAGULASE NEGATIVE) THE SIGNIFICANCE OF ISOLATING THIS ORGANISM FROM A SINGLE SET OF BLOOD CULTURES WHEN MULTIPLE SETS ARE DRAWN IS UNCERTAIN. PLEASE NOTIFY THE MICROBIOLOGY DEPARTMENT WITHIN ONE WEEK IF SPECIATION AND SENSITIVITIES ARE REQUIRED. Performed at [facility], [address] [place], [place] [address]    Report Status 06/25/2018 FINAL  Final  Blood Culture ID Panel (Reflexed)     Status: Abnormal   Collection Time: 06/23/18  6:27 AM  Result Value Ref Range Status   Enterococcus species NOT DETECTED NOT DETECTED Final   Listeria monocytogenes NOT DETECTED NOT DETECTED Final   Staphylococcus species DETECTED (A) NOT  DETECTED Final    Comment: Methicillin (oxacillin) resistant coagulase negative staphylococcus. Possible blood culture contaminant (unless isolated from more than one blood culture draw or clinical case suggests pathogenicity). No antibiotic treatment is indicated for blood  culture contaminants. CRITICAL RESULT CALLED TO, READ BACK BY AND VERIFIED WITH: K.AMEND,PHARMD AT 0542 ON 06/25/18 BY G.MCADOO    Staphylococcus aureus (BCID) NOT DETECTED NOT DETECTED Final   Methicillin resistance DETECTED (A) NOT DETECTED Final    Comment: CRITICAL RESULT CALLED TO, READ BACK BY AND VERIFIED WITH: K.AMEND,PHARMD AT 0542 ON 06/25/18 BY G.MCADOO    Streptococcus species NOT DETECTED NOT DETECTED Final   Streptococcus agalactiae NOT DETECTED NOT DETECTED Final   Streptococcus pneumoniae NOT DETECTED NOT DETECTED Final   Streptococcus pyogenes NOT DETECTED NOT DETECTED Final   Acinetobacter baumannii NOT DETECTED NOT DETECTED Final   Enterobacteriaceae species NOT DETECTED NOT DETECTED Final   Enterobacter cloacae complex NOT DETECTED NOT DETECTED Final   Escherichia coli NOT DETECTED NOT DETECTED Final   Klebsiella oxytoca NOT DETECTED NOT DETECTED Final   Klebsiella pneumoniae NOT DETECTED NOT DETECTED Final   Proteus species NOT DETECTED NOT DETECTED Final   Serratia marcescens NOT DETECTED NOT DETECTED Final   Haemophilus influenzae NOT DETECTED NOT DETECTED Final   Neisseria meningitidis NOT DETECTED NOT DETECTED Final   Pseudomonas aeruginosa NOT DETECTED NOT DETECTED Final   Candida albicans NOT DETECTED NOT DETECTED Final   Candida glabrata NOT DETECTED NOT DETECTED Final   Candida krusei NOT DETECTED NOT DETECTED Final   Candida parapsilosis NOT DETECTED NOT DETECTED Final   Candida tropicalis NOT DETECTED NOT DETECTED Final    Comment: Performed at [facility], [address], [place], [place] [address]      Radiology Studies: US Renal  Result Date: 06/25/2018 CLINICAL DATA:   Acute renal failure, history diabetes mellitus, CHF EXAM: RENAL / URINARY TRACT ULTRASOUND COMPLETE COMPARISON:  None Correlation: CTA chest 06/22/2018 FINDINGS: Right Kidney: Length: 9.0 cm. Normal cortical thickness and echogenicity. No mass, hydronephrosis or shadowing calcification. Image quality degraded secondary to body habitus. Left Kidney: LEFT kidney not visualized sonographically, likely due to a combination of body habitus and bowel gas. The upper pole of a normal appearing LEFT kidney was identified on a recent CTA chest exam. Bladder: Decompressed, unable to evaluate. IMPRESSION: Grossly normal appearing RIGHT kidney. Sonographic nonvisualization of the LEFT kidney. Electronically Signed   By: Ulyses Southward M.D.   On: 06/25/2018 15:25     Medications:  Scheduled: . bacitracin   Topical BID  . Chlorhexidine Gluconate Cloth  6 each Topical Q0600  . mouth rinse  15 mL Mouth Rinse BID  . metoprolol tartrate  50 mg Oral BID  . mupirocin ointment  1 application Nasal BID  . nystatin  1 g Topical BID   Continuous: . sodium chloride 10 mL/hr at 06/26/18 1100  . 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  He underwent CT scan of the head and cervical spine which did not show any thing acute.  MRI could not be done there as the patient has a pacemaker.  He was transferred here.  Found to have significant cervical spine stenosis with cord flattening and cord edema noted on MRI.  Neurosurgery was consulted.  Patient started on dexamethasone.  Plan was to operate sometime this week.   Patient did request transfer to [facility].  Discussions were held with providers at [facility] as well as [facility].  However due to potential for delaying care the providers they recommended that the patient stay in this hospital.  Patient was seen by Dr. Jordan Likes.  Patient however has declined over the last 3 days.  He has worsening renal function.  Family is choosing more comfort care pathway.  Will hold off on surgery for now.   Acute metabolic encephalopathy likely hepatic encephalopathy This was noted at the other facility.  He was found to have minimally elevated ammonia level and was placed on lactulose.  MRI brain done did not show any acute stroke.  Chronic infarct was noted.  Patient's wife also mentioned that he was recently diagnosed as having vascular dementia and has been noted to have cognitive deficits.  So his altered mental status is thought to be multifactorial including hepatic encephalopathy considering elevated lactulose level, new environment, history of dementia, steroids.  Patient had to be given Haldol.  He was also given lactulose enemas.  Prognosis is very poor at this time.    Dyspnea Patient noted to be tachypneic this morning.  Auscultation does not reveal any obvious abnormalities.  Chest x-ray was ordered.  However it appears that patient's family pursuing a comfort care strategy.  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  However this was discussed with his wife.  She tells me that there is no diagnosis offered for his liver condition.  He underwent MRI of his hepatobiliary system recently which did not even show findings of liver cirrhosis.  Hepatic congestion was noted which was thought to be due to his diastolic CHF.  His LFTs are normal.  His bilirubin is 1.3.  Does not need GI input at this time.  Noted to be on ceftriaxone for reasons are unclear, presumably for SBP prophylaxis. Was previously on Bactrim.  His  abdomen is benign making the likelihood of SBP low.  Since comfort care is being pursued his antibiotics could be discontinued.  Coagulase-negative staph aureus bacteremia This is in 1 out of 4 sets.  Most likely a contaminant.  Diabetes mellitus type II CBG is elevated due to steroids.  Dose of Lantus was increased.  Steroids will likely be discontinued due to comfort care.  Acute on chronic kidney disease stage III/Hypokalemia Baseline creatinine appears to be around 1.5.  Patient with acute worsening in his renal function.  Most likely due to contrast nephropathy.  Appreciate nephrology input.  Patient not a candidate for dialysis.  Family does not want to pursue dialysis.  Potassium was noted to be mildly elevated this morning.  He was ordered Kayexalate.  Renal ultrasound did not show any obvious abnormality.  Left kidney was not visualized.    Atrial fibrillation with RVR Patient was on metoprolol and Eliquis as an outpatient.  Due to his encephalopathy he is not been able to take his medications consistently.  Patient was noted to be tachycardic yesterday.  Cardiology was consulted.  However now patient heading towards hospice care.  Will not be too aggressive.  Continue to hold anticoagulation.  His chads 2 vascular score is 7.  Chronic combined systolic and diastolic CHF Echocardiogram from August 2019 showed a EF of 35%.  Patient was on diuretics which are on hold due to worsening renal function.  His weight was recorded as 102.1 kg on 10/3.  Previous records reviewed.  His weight was 104.6 kg on 9/8.  It has been very hard to assess his volume status.  His weight appears to be stable for the most part.  Comfort care being pursued.  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  Previous history of stroke Stable.  Noted to be on statin.  Has been on anticoagulation which is on hold.  Hypothyroidism Continue home medications.  TSH was 1.1 on 10/2.  History of  gout Has been on allopurinol  Multiple skin lesions abrasions and skin tears He has been seen by wound care.  Abnormal appearing urine Moderate hemoglobin trace leukocytes 11-20 RBC 11-20 WBC noted.  Urine culture did not show any significant growth.  Unlikely the patient has clinically relevant urinary tract infection.  Abnormal finding on CT-head 06/21/18 CT-head showed a 1 cm lytic lesion RIGHT frontal calvarium. This could reflect atypical hemangioma per radiologist, though metastatic disease not excluded. Of note, pt was found to have LEFT periauricular metallic foreign bodies on CT-scan, MRI may be nondiagnostic though not contraindicated per radiologist. -f/u to repeat CT-head as outpt with PCP  Goals of care Palliative medicine is following.  Discussed with Lorinda Creed this morning.  Comfort care being pursued after discussions with family.  Family wants him to go to a residential hospice facility near [facility].  Social worker also to be involved.  Will minimize medications.  DVT Prophylaxis: Subcutaneous heparin    Code Status: DNR Family Communication: Discussed with patient's wife at bedside. Disposition Plan: Patient continues to decline.  Poor prognosis.  Comfort care/hospice being pursued.    LOS: 3 days   Osvaldo Shipper  [facility] Pager [phone] 06/26/2018, 11:03 AM  If 7PM-7AM, please contact night-coverage at [email or web address], password [facility]

## 2018-06-26 NOTE — Clinical Social Work Note (Signed)
Clinical Social Work Assessment  Patient Details  Name: Leonard Welch MRN: [id number] Date of Birth: 1941/02/01  Date of referral:  06/26/18               Reason for consult:  Facility Placement, End of Life/Hospice, Discharge Planning                Permission sought to share information with:  Facility Medical sales representative, Family Supports Permission granted to share information::     Name::     Conn Trombetta  Agency::  Hospice facilities  Relationship::  Wife  Contact Information:  [phone]  Housing/Transportation Living arrangements for the past 2 months:  Skilled Nursing Facility, Assisted Living Facility Source of Information:  Medical Team, Adult Children Patient Interpreter Needed:  None Criminal Activity/Legal Involvement Pertinent to Current Situation/Hospitalization:  No - Comment as needed Significant Relationships:  Adult Children, Other Family Members, Spouse Lives with:  Facility Resident Do you feel safe going back to the place where you live?  Yes Need for family participation in patient care:  Yes (Comment)  Care giving concerns:  Patient now comfort care and in need of hospice facility.   Social Worker assessment / plan:  Patient not fully oriented. Multiple family members at bedside. CSW introduced role and explained that discharge planning would be discussed. Patient's family confirmed plan for discharge to hospice facility. First preference is [facility] in [place]. CSW spoke with hospice staff and faxed referral. No further concerns. CSW encouraged patient's family to contact CSW as needed. CSW will continue to follow patient and his family for support and facilitate discharge to hospice facility once bed available.  Employment status:  Retired Health and safety inspector:  Medicare PT Recommendations:  Not assessed at this time Information / Referral to community resources:  Other (Comment Required)(Hospice facilities)  Patient/Family's Response to  care:  Patient not fully oriented. Patient's family agreeable to hospice facility. Patient's family supportive and involved in patient's care. Patient's family appreciated social work intervention.  Patient/Family's Understanding of and Emotional Response to Diagnosis, Current Treatment, and Prognosis:  Patient not fully oriented. Patient's family have a good understanding of the reason for admission and prognosis. Patient's family appear happy with hospital care.  Emotional Assessment Appearance:  Appears stated age Attitude/Demeanor/Rapport:  Unable to Assess Affect (typically observed):  Unable to Assess Orientation:  Oriented to Self Alcohol / Substance use:  Never Used Psych involvement (Current and /or in the community):  No (Comment)  Discharge Needs  Concerns to be addressed:  Care Coordination Readmission within the last 30 days:  No Current discharge risk:  Terminally ill Barriers to Discharge:  Other(Waiting on hospice facility decision)   Margarito Liner, LCSW 06/26/2018, 12:33 PM

## 2018-06-26 NOTE — Progress Notes (Signed)
Inpatient Diabetes Program Recommendations  AACE/ADA: New Consensus Statement on Inpatient Glycemic Control (2019)  Target Ranges:  Prepandial:   less than 140 mg/dL      Peak postprandial:   less than 180 mg/dL (1-2 hours)      Critically ill patients:  140 - 180 mg/dL   Results for Leonard Welch, Leonard Welch (MRN [id number]) as of 06/26/2018 08:38  Ref. Range 06/25/2018 07:41 06/25/2018 11:13 06/25/2018 17:47 06/25/2018 20:48 06/26/2018 07:47  Glucose-Capillary Latest Ref Range: 70 - 99 mg/dL 063 (H) 016 (H) 010 (H) 235 (H) 292 (H)   Review of Glycemic Control  Diabetes history: DM2 Outpatient Diabetes medications: Lantus 18 units QHS, Novolog 10 units TID with meals Current orders for Inpatient glycemic control: Lantus 20 units QHS, Novolog 0-9 units TID with meals; Decadron 10 mg Q6H  Inpatient Diabetes Program Recommendations:  Insulin - Basal: If Decadron is continued, please consider increasing Lantus 25 units QHS. Correction (SSI): Please consider ordering Novolog 0-5 units QHS for bedtime correction. Insulin - Meal Coverage: If Decadron is continued and once diet is resumed, please consider ordering Novolog 4 units TID with meals for meal coverage if patient eats at least 50% of meals.  Thanks, Orlando Penner, RN, MSN, CDE Diabetes Coordinator Inpatient Diabetes Program [phone] (Team Pager from 8am to 5pm)

## 2018-06-26 NOTE — Clinical Social Work Note (Signed)
Patient has a bed at hospice house in [place] today. CSW faxed over Statement of Terminal Illness that MD signed. Family aware they will not be giving antibiotics at the facility. RN notified to leave in IV's and foley. Wife will go to facility to complete paperwork.   Charlynn Court, CSW [phone]

## 2018-06-26 NOTE — Clinical Social Work Note (Signed)
CSW facilitated patient discharge including contacting patient family (Wife and son) and facility to confirm patient discharge plans. Clinical information faxed to facility and family agreeable with plan. CSW arranged ambulance transport via [facility] to [facility] in [place]. RN to call report prior to discharge [phone]).  CSW will sign off for now as social work intervention is no longer needed. Please consult [place] again if new needs arise.  Charlynn Court, CSW [phone]

## 2018-06-26 NOTE — Discharge Summary (Signed)
[facility]  Physician Discharge Summary   Patient ID: Leonard Welch MRN: [id number] DOB/AGE: Nov 17, 1940 77 y.o.  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 Pt was initially admitted to [facility] hospital on 06/21/18 due to altered mental status. Pt has been treated for suspected hepatic encephalopathy with lactulose. Mental status has improved, but was found to have new onset letter upper extremity weakness. Neurology recommended CT head, cervical spine, and angiogram of chest which were unrevealing. MRI of cervical spine recommended to assess for cord contusion, however unable to perform at [place] due to PPM.   Reason for Visit: Bilateral upper extremity weakness  Consultants: Neurology.  Neurosurgery.  Nephrology .  Cardiology: Dr. Algie Coffer.  Palliative medicine  Procedures:  Transthoracic echocardiogram Study Conclusions  - Left ventricle: The cavity size was normal. There was moderate concentric hypertrophy. Systolic function was moderately  reduced. The estimated ejection fraction was in the range of 35% to 40%. There is moderate hypokinesis of the entire myocardium. The study is not technically sufficient to allow evaluation of LV diastolic function. - Mitral valve: There was mild regurgitation. - Left atrium: The atrium was mildly dilated. - Right ventricle: The cavity size was mildly dilated. Wall thickness was normal. Systolic function was moderately to severely reduced. - Right atrium: The atrium was moderately dilated. - Tricuspid valve: There was moderate regurgitation. - Pulmonary arteries: Systolic pressure was moderately to severely increased. PA peak pressure: 72 mm Hg (S).    HOSPITAL COURSE:   Severe cervical spine stenosis with cord flattening likely resulting in bilateral upper extremity weakness Patient was noted to have bilateral upper extremity weakness at [facility].  He underwent CT scan of the head and cervical spine which did not show any thing acute.  MRI could not be done there as the patient has a pacemaker.  He was transferred here.  Found to have significant cervical spine stenosis with cord flattening and cord edema noted on MRI.  Neurosurgery was consulted.  Patient started on dexamethasone.  Plan was to operate sometime this week.  Patient did request transfer to [facility].  Discussions were held with providers at [facility] as well as [facility].  However due to potential for delaying care the providers there recommended that the patient stay in this hospital.  Patient was seen by Dr. Jordan Likes.  Patient however has declined over the last 3 days.  He has worsening renal function.  Family is choosing more comfort care pathway.   Acute metabolic encephalopathy likely hepatic encephalopathy This was noted at the other facility.  He was found to have minimally elevated ammonia level and was placed on lactulose.  MRI brain done did not show any acute stroke.  Chronic infarct was  noted.   Patient's wife also mentioned that he was recently diagnosed as having vascular dementia and has been noted to have cognitive deficits.  So his altered mental status is thought to be multifactorial including hepatic encephalopathy considering elevated lactulose level, new environment, history of dementia, steroids.  Patient had to be given Haldol.  He was also given lactulose enemas.  Prognosis is very poor at this time.    Dyspnea Patient noted to be tachypneic this morning.  Auscultation does not reveal any obvious abnormalities.  Chest x-ray was ordered.  However it appears that patient's family pursuing a comfort care strategy.  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  However this was discussed with his wife.  She tells me that there is no diagnosis offered for his liver condition.  He underwent MRI of his hepatobiliary system recently which did not even show findings of liver cirrhosis.  Hepatic congestion was noted which was thought to be due to his diastolic CHF.  His LFTs are normal.  His bilirubin is 1.3.  Does not need GI input at this time.  Noted to be on ceftriaxone for reasons are unclear, presumably for SBP prophylaxis. Was previously on Bactrim.  His abdomen is benign making the likelihood of SBP low.  Since comfort care is being pursued his antibiotics could be discontinued.  Coagulase-negative staph aureus bacteremia This is in 1 out of 4 sets.  Most likely a contaminant.  Diabetes mellitus type II CBG is elevated due to steroids.  Dose of Lantus was increased.  Steroids will likely be discontinued due to comfort care.  Acute on chronic kidney disease stage III/Hypokalemia Baseline creatinine appears to be around 1.5.  Patient with acute worsening in his renal function.  Most likely due to contrast nephropathy.  Appreciate nephrology input.  Patient not a candidate for dialysis.  Family does not want to pursue  dialysis.  Potassium was noted to be mildly elevated this morning.  He was ordered Kayexalate.  Renal ultrasound did not show any obvious abnormality.  Left kidney was not visualized.    Atrial fibrillation with RVR Patient was on metoprolol and Eliquis as an outpatient.  Due to his encephalopathy he is not been able to take his medications consistently.  Patient was noted to be tachycardic yesterday.  Cardiology was consulted.  However now patient heading towards hospice care.  Continue to hold anticoagulation.  His chads 2 vascular score is 7.  Chronic combined systolic and diastolic CHF Echocardiogram from August 2019 showed a EF of 35%.  Patient was on diuretics which are on hold due to worsening renal function.  His weight was recorded as 102.1 kg on 10/3.  Previous records reviewed.  His weight was 104.6 kg on 9/8.  It has been very hard to assess his volume status.  His weight appears to be stable for the most part.  Comfort care being pursued.  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  Previous history of stroke Stable.  Noted to be on statin.  Has been on anticoagulation which is on hold.  Hypothyroidism Continue home medications.  TSH was 1.1 on 10/2.  History of gout Has been on allopurinol  Multiple skin lesions abrasions and skin tears He has been seen by wound care.  Abnormal appearing urine Moderate hemoglobin trace leukocytes 11-20 RBC 11-20 WBC noted.  Urine culture did not show any significant growth.  Unlikely the patient has  clinically relevant urinary tract infection.  Abnormal finding on CT-head 06/21/18 CT-head showed a 1 cm lytic lesion RIGHT frontal calvarium. This could reflect atypical hemangioma per radiologist, though metastatic disease not excluded. Of note, pt was found to have LEFT periauricular metallic foreign bodies on CT-scan, MRI may be nondiagnostic though not contraindicated per radiologist. -f/u to repeat CT-head  as outpt with PCP  Goals of care Palliative medicine is following.  Discussed with Lorinda Creed this morning.  Comfort care being pursued after discussions with family.  Family wants him to go to a residential hospice facility near [facility].  Social worker also to be involved.  Will minimize medications.  Patient accepted to residential hospice facility closer to patient's home.  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  Okay for transfer to residential hospice.    PERTINENT LABS:  The results of significant diagnostics from this hospitalization (including imaging, microbiology, ancillary and laboratory) are listed below for reference.    Microbiology: Recent Results (from the past 240 hour(s))  MRSA PCR Screening     Status: Abnormal   Collection Time: 06/21/18 10:21 AM  Result Value Ref Range Status   MRSA by PCR POSITIVE (A) NEGATIVE Final    Comment:        The GeneXpert MRSA Assay (FDA approved for NASAL specimens only), is one component of a comprehensive MRSA colonization surveillance program. It is not intended to diagnose MRSA infection nor to guide or monitor treatment for MRSA infections. RESULT CALLED TO, READ BACK BY AND VERIFIED WITH: BERENICE AJEJO AT 1154 ON 06/21/2018, KP Performed at [facility], [address], [place], [place] [address]   Urine culture     Status: Abnormal   Collection Time: 06/21/18  4:30 PM  Result Value Ref Range Status   Specimen Description   Final    URINE, RANDOM Performed at [facility], [address] [place], [place] [address]    Special Requests   Final    NONE Performed at [facility], [address] [place], [place] [address]    Culture (A)  Final    <10,000 COLONIES/mL INSIGNIFICANT GROWTH Performed at [facility], [address], [place], [place] [address]    Report Status 06/23/2018 FINAL  Final  Culture, blood (Routine X 2) w Reflex to ID Panel     Status: None  (Preliminary result)   Collection Time: 06/23/18  6:20 AM  Result Value Ref Range Status   Specimen Description BLOOD LEFT ANTECUBITAL  Final   Special Requests   Final    BOTTLES DRAWN AEROBIC AND ANAEROBIC Blood Culture adequate volume   Culture   Final    NO GROWTH 3 DAYS Performed at [facility], [address], [place], [place] [address]    Report Status PENDING  Incomplete  Culture, blood (Routine X 2) w Reflex to ID Panel     Status: Abnormal   Collection Time: 06/23/18  6:27 AM  Result Value Ref Range Status   Specimen Description BLOOD LEFT ARM  Final   Special Requests   Final    BOTTLES DRAWN AEROBIC ONLY Blood Culture adequate volume   Culture  Setup Time   Final    GRAM POSITIVE COCCI IN CLUSTERS AEROBIC BOTTLE ONLY CRITICAL RESULT CALLED TO, READ BACK BY AND VERIFIED WITH: K.AMEND,PHARMD AT 0542 ON 06/25/18 BY G.MCADOO    Culture (A)  Final    STAPHYLOCOCCUS SPECIES (COAGULASE NEGATIVE) THE SIGNIFICANCE OF ISOLATING THIS  ORGANISM FROM A SINGLE SET OF BLOOD CULTURES WHEN MULTIPLE SETS ARE DRAWN IS UNCERTAIN. PLEASE NOTIFY THE MICROBIOLOGY DEPARTMENT WITHIN ONE WEEK IF SPECIATION AND SENSITIVITIES ARE REQUIRED. Performed at [facility], [address] [place], [place] [address]    Report Status 06/25/2018 FINAL  Final  Blood Culture ID Panel (Reflexed)     Status: Abnormal   Collection Time: 06/23/18  6:27 AM  Result Value Ref Range Status   Enterococcus species NOT DETECTED NOT DETECTED Final   Listeria monocytogenes NOT DETECTED NOT DETECTED Final   Staphylococcus species DETECTED (A) NOT DETECTED Final    Comment: Methicillin (oxacillin) resistant coagulase negative staphylococcus. Possible blood culture contaminant (unless isolated from more than one blood culture draw or clinical case suggests pathogenicity). No antibiotic treatment is indicated for blood  culture contaminants. CRITICAL RESULT CALLED TO, READ BACK BY AND VERIFIED  WITH: K.AMEND,PHARMD AT 0542 ON 06/25/18 BY G.MCADOO    Staphylococcus aureus (BCID) NOT DETECTED NOT DETECTED Final   Methicillin resistance DETECTED (A) NOT DETECTED Final    Comment: CRITICAL RESULT CALLED TO, READ BACK BY AND VERIFIED WITH: K.AMEND,PHARMD AT 0542 ON 06/25/18 BY G.MCADOO    Streptococcus species NOT DETECTED NOT DETECTED Final   Streptococcus agalactiae NOT DETECTED NOT DETECTED Final   Streptococcus pneumoniae NOT DETECTED NOT DETECTED Final   Streptococcus pyogenes NOT DETECTED NOT DETECTED Final   Acinetobacter baumannii NOT DETECTED NOT DETECTED Final   Enterobacteriaceae species NOT DETECTED NOT DETECTED Final   Enterobacter cloacae complex NOT DETECTED NOT DETECTED Final   Escherichia coli NOT DETECTED NOT DETECTED Final   Klebsiella oxytoca NOT DETECTED NOT DETECTED Final   Klebsiella pneumoniae NOT DETECTED NOT DETECTED Final   Proteus species NOT DETECTED NOT DETECTED Final   Serratia marcescens NOT DETECTED NOT DETECTED Final   Haemophilus influenzae NOT DETECTED NOT DETECTED Final   Neisseria meningitidis NOT DETECTED NOT DETECTED Final   Pseudomonas aeruginosa NOT DETECTED NOT DETECTED Final   Candida albicans NOT DETECTED NOT DETECTED Final   Candida glabrata NOT DETECTED NOT DETECTED Final   Candida krusei NOT DETECTED NOT DETECTED Final   Candida parapsilosis NOT DETECTED NOT DETECTED Final   Candida tropicalis NOT DETECTED NOT DETECTED Final    Comment: Performed at [facility], [address] [place], [place] [address]     Labs: Basic Metabolic Panel: Recent Labs  Lab 06/21/18 0727 06/22/18 0559 06/23/18 0620 06/24/18 0631 06/25/18 0900 06/26/18 0539  NA  --  141 141 140 135 136  K  --  3.3* 3.4* 4.8 5.1 5.2*  CL  --  106 101 103 102 101  CO2  --  27 25 21* 16* 18*  GLUCOSE  --  154* 168* 220* 310* 296*  BUN  --  31* 28* 40* 70* 93*  CREATININE  --  1.90* 2.11* 3.13* 4.71* 5.63*  CALCIUM  --  9.9 10.1 9.8 9.5 9.5  MG 2.2  --   2.2  --   --   --   PHOS 2.9  --   --   --   --  5.9*   Liver Function Tests: Recent Labs  Lab 06/21/18 0525 06/23/18 0620 06/24/18 0631 06/26/18 0539  AST 45* 27 24  --   ALT 21 18 16   --   ALKPHOS 224* 194* 181*  --   BILITOT 2.2* 1.5* 1.3*  --   PROT 7.9 7.6 7.0  --   ALBUMIN 3.6 3.1* 2.9* 3.2*  Recent Labs  Lab 06/21/18 0644 06/22/18 0835 06/24/18 1033 06/25/18 0900  AMMONIA 36* 11 39* 84*   CBC: Recent Labs  Lab 06/21/18 0525 06/23/18 0620 06/24/18 0752 06/26/18 0539  WBC 10.1 10.7* 10.2 9.7  NEUTROABS  --  7.9*  --   --   HGB 15.3 16.5 15.7 16.0  HCT 44.2 50.8 49.0 49.0  MCV 93.3 95.3 95.0 93.5  PLT 201 210 233 268   BNP: BNP (last 3 results) Recent Labs    06/23/18 0611  BNP 971.3*    CBG: Recent Labs  Lab 06/25/18 0741 06/25/18 1113 06/25/18 1747 06/25/18 2048 06/26/18 0747  GLUCAP 313* 292* 223* 235* 292*     IMAGING STUDIES Dg Chest 1 View  Result Date: 06/21/2018 CLINICAL DATA:  Acute mental status changes with confusion. Indwelling cardiac pacemaker. Current history of chronic kidney disease and diabetes. EXAM: Portable CHEST 1 VIEW COMPARISON:  None. FINDINGS: Cardiac silhouette markedly enlarged even allowing for AP portable technique. Thoracic aorta atherosclerotic. Hilar and mediastinal contours otherwise unremarkable. LEFT subclavian single lead transvenous pacemaker intact. Mild pulmonary venous hypertension without overt edema. Elevation of the LEFT hemidiaphragm with mild atelectasis at the LEFT lung base. Lungs otherwise clear. No visible pleural effusions. IMPRESSION: 1. Mild LEFT basilar atelectasis with elevation of the LEFT hemidiaphragm. No acute cardiopulmonary disease otherwise. 2. Marked cardiomegaly. Mild pulmonary venous hypertension without evidence of overt pulmonary edema currently. Electronically Signed   By: Hulan Saas M.D.   On: 06/21/2018 13:45   Ct Head Wo Contrast  Result Date: 06/21/2018 CLINICAL DATA:   Fall, forehead laceration. History of encephalopathy. EXAM: CT HEAD WITHOUT CONTRAST TECHNIQUE: Contiguous axial images were obtained from the base of the skull through the vertex without intravenous contrast. COMPARISON:  CT HEAD June 21, 2018 at 0456 hours FINDINGS: Motion degraded examination. BRAIN: No intraparenchymal hemorrhage, mass effect, midline shift or acute large vascular territory infarcts. Mesial RIGHT temporal occipital lobe encephalomalacia with mild ex vacuo dilatation RIGHT ventricle atrium. No hydrocephalus. Patchy supratentorial white matter hypodensities. No abnormal extra-axial fluid collections. VASCULAR: Moderate calcific atherosclerosis of the carotid siphons. SKULL: No skull fracture. 1 cm atypical lytic lesion RIGHT frontal calvarium (axial image 42). No significant scalp soft tissue swelling. Metallic foreign bodies LEFT periauricular soft tissues. SINUSES/ORBITS: Trace paranasal sinus mucosal thickening. Mastoid air cells are well aerated.The included ocular globes and orbital contents are non-suspicious. OTHER: None. IMPRESSION: 1. Motion degraded examination.  No acute intracranial process. 2. 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 3. 1 cm lytic lesion RIGHT frontal calvarium could reflect atypical hemangioma though, metastatic disease not excluded. Given presence of LEFT periauricular metallic foreign bodies, MRI may be nondiagnostic though, not contraindicated. Electronically Signed   By: Awilda Metro M.D.   On: 06/21/2018 18:12   Ct Head Wo Contrast  Result Date: 06/21/2018 CLINICAL DATA:  77 y/o M; fall with laceration to the head, confusion, altered mental status. EXAM: CT HEAD WITHOUT CONTRAST CT CERVICAL SPINE WITHOUT CONTRAST TECHNIQUE: Multidetector CT imaging of the head and cervical spine was performed following the standard protocol without intravenous contrast. Multiplanar CT image reconstructions of the cervical spine were also generated.  COMPARISON:  06/20/2018 CT head. FINDINGS: CT HEAD FINDINGS Brain: No evidence of acute infarction, hemorrhage, hydrocephalus, extra-axial collection or mass lesion/mass effect. Right occipital lobe chronic infarction. Very small chronic infarction in right frontal white matter. Moderate volume loss of the brain. Vascular: Calcific atherosclerosis of carotid siphons. Skull: Mild left frontal scalp contusion. No calvarial  fracture identified. Sinuses/Orbits: No acute finding. Other: None. CT CERVICAL SPINE FINDINGS Alignment: Normal. Skull base and vertebrae: No acute fracture. No primary bone lesion or focal pathologic process. Soft tissues and spinal canal: Postsurgical changes within the left anterior neck. Disc levels: Multilevel disc and facet degenerative changes prominent endplate marginal osteophytes greatest at C6-7. Uncovertebral and facet hypertrophy results in bilateral C3-4, left C4-5, bilateral C5-6, bilateral C6-7 foraminal stenosis. Multilevel mild to moderate canal stenosis. Upper chest: Calcified granulomas in the lung apices. Other: Negative. IMPRESSION: 1. Mild left frontal scalp contusion. No calvarial fracture. 2. No acute intracranial abnormality identified. 3. No acute fracture or dislocation of the cervical spine identified. 4. 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 5. Moderate cervical spondylosis greatest at C6-7 level. Electronically Signed   By: Mitzi Hansen M.D.   On: 06/21/2018 05:56   Ct Head Wo Contrast  Result Date: 06/20/2018 CLINICAL DATA:  Unwitnessed fall, trauma EXAM: CT HEAD WITHOUT CONTRAST TECHNIQUE: Contiguous axial images were obtained from the base of the skull through the vertex without intravenous contrast. COMPARISON:  None. FINDINGS: Brain: Brain atrophy noted with chronic white matter microvascular changes throughout both cerebral hemispheres. Medial right occipital lobe encephalomalacia posteriorly compatible  with a previous right PCA territory infarct. No acute intracranial hemorrhage, new mass lesion, new infarction, midline shift, herniation, hydrocephalus, or extra-axial fluid collection. No focal mass effect or edema. Cisterns are patent. Cerebellar atrophy as well. Vascular: Intracranial atherosclerosis noted.  No hyperdense vessel. Skull: Normal. Negative for fracture or focal lesion. Sinuses/Orbits: No acute finding. Other: None. IMPRESSION: Atrophy and chronic white matter microvascular changes. Remote right occipital PCA territory infarct No acute intracranial abnormality by noncontrast CT. Electronically Signed   By: Judie Petit.  Shick M.D.   On: 06/20/2018 18:51   Ct Cervical Spine Wo Contrast  Result Date: 06/22/2018 CLINICAL DATA:  Bilateral upper extremity weakness after fall. EXAM: CT CERVICAL SPINE WITHOUT CONTRAST TECHNIQUE: Multidetector CT imaging of the cervical spine was performed without intravenous contrast. Multiplanar CT image reconstructions were also generated. COMPARISON:  CT scan of June 21, 2018. FINDINGS: Alignment: Normal. Skull base and vertebrae: No acute fracture. No primary bone lesion or focal pathologic process. Soft tissues and spinal canal: No prevertebral fluid or swelling. No visible canal hematoma. Disc levels: Moderate anterior osteophyte formation is noted at C4-5 and C5-6. Moderate degenerative disc disease is noted at C6-7 and C7-T1. Mild to moderate right-sided neural foraminal stenosis is noted at C6-7 secondary to uncovertebral spurring. Upper chest: Negative. Other: None. IMPRESSION: No fracture or spondylolisthesis is noted in the cervical spine. Multilevel degenerative disc disease is noted. Mild to moderate right-sided neural foraminal stenosis is noted at C6-7 secondary to uncovertebral spurring. Electronically Signed   By: Lupita Raider, M.D.   On: 06/22/2018 17:24   Ct Cervical Spine Wo Contrast  Result Date: 06/21/2018 CLINICAL DATA:  77 y/o M; fall with  laceration to the head, confusion, altered mental status. EXAM: CT HEAD WITHOUT CONTRAST CT CERVICAL SPINE WITHOUT CONTRAST TECHNIQUE: Multidetector CT imaging of the head and cervical spine was performed following the standard protocol without intravenous contrast. Multiplanar CT image reconstructions of the cervical spine were also generated. COMPARISON:  06/20/2018 CT head. FINDINGS: CT HEAD FINDINGS Brain: No evidence of acute infarction, hemorrhage, hydrocephalus, extra-axial collection or mass lesion/mass effect. Right occipital lobe chronic infarction. Very small chronic infarction in right frontal white matter. Moderate volume loss of the brain. Vascular: Calcific atherosclerosis of carotid  siphons. Skull: Mild left frontal scalp contusion. No calvarial fracture identified. Sinuses/Orbits: No acute finding. Other: None. CT CERVICAL SPINE FINDINGS Alignment: Normal. Skull base and vertebrae: No acute fracture. No primary bone lesion or focal pathologic process. Soft tissues and spinal canal: Postsurgical changes within the left anterior neck. Disc levels: Multilevel disc and facet degenerative changes prominent endplate marginal osteophytes greatest at C6-7. Uncovertebral and facet hypertrophy results in bilateral C3-4, left C4-5, bilateral C5-6, bilateral C6-7 foraminal stenosis. Multilevel mild to moderate canal stenosis. Upper chest: Calcified granulomas in the lung apices. Other: Negative. IMPRESSION: 1. Mild left frontal scalp contusion. No calvarial fracture. 2. No acute intracranial abnormality identified. 3. No acute fracture or dislocation of the cervical spine identified. 4. 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 5. Moderate cervical spondylosis greatest at C6-7 level. Electronically Signed   By: Mitzi Hansen M.D.   On: 06/21/2018 05:56   Mr Brain Wo Contrast  Result Date: 06/23/2018 CLINICAL DATA:  Stroke.  Bilateral arm weakness. EXAM: MRI  HEAD WITHOUT CONTRAST TECHNIQUE: Multiplanar, multiecho pulse sequences of the brain and surrounding structures were obtained without intravenous contrast. COMPARISON:  CT head 06/21/2018 FINDINGS: Brain: MRI compatible pacemaker was monitored during the scan. No adverse effects. Moderate atrophy. Negative for acute infarct. Chronic hemorrhagic infarct right occipital lobe. Small chronic infarct right thalamus. Negative for mass or edema. Vascular: Normal arterial flow voids Skull and upper cervical spine: Negative Sinuses/Orbits: Bilateral cataract surgery.  Paranasal sinuses clear Other: None IMPRESSION: No acute abnormality Chronic hemorrhagic infarct right occipital lobe with generalized atrophy. Electronically Signed   By: Marlan Palau M.D.   On: 06/23/2018 16:00   Mr Cervical Spine Wo Contrast  Result Date: 06/23/2018 CLINICAL DATA:  Recent fall. Bilateral upper extremity weakness. Myelopathy. EXAM: MRI CERVICAL SPINE WITHOUT CONTRAST TECHNIQUE: Multiplanar, multisequence MR imaging of the cervical spine was performed. No intravenous contrast was administered. COMPARISON:  Cervical spine CT 06/22/2018 FINDINGS: Multiple sequences are motion degraded with severe motion on the axial gradient echo sequence and milder motion on the axial spin echo sequence. Significant artifact is also present on the sagittal STIR and sagittal gradient echo sequences. Alignment: Trace retrolisthesis of C3 on C4. Vertebrae: No fracture, suspicious osseous lesion, or significant marrow edema. Cord: Possible low level T2/STIR hyperintensity in the cord at C3-4. No gross cord hemorrhage. Posterior Fossa, vertebral arteries, paraspinal tissues: At most trace prevertebral fluid in the mid and lower cervical spine without other findings to clearly indicate ligamentous injury. At most minimal posterior soft tissue edema. Grossly preserved vertebral artery flow voids. Disc levels: Assessment of degenerative changes is limited by  motion artifact on axial imaging. The cervical spinal canal is mildly small in caliber diffusely on a congenital basis. C2-3: Disc bulging, uncovertebral spurring, and mild facet arthrosis result in mild spinal stenosis and mild bilateral neural foraminal stenosis. C3-4: Disc bulging, broad posterior disc protrusion (partially calcified on CT), mild infolding of the ligamentum flavum, and mild facet arthrosis result in severe spinal stenosis with moderate cord flattening and moderate to severe right and severe left neural foraminal stenosis. C4-5: Disc bulging, uncovertebral spurring, and mild facet arthrosis result in moderate spinal stenosis and mild right and moderate left neural foraminal stenosis. C5-6: Mild disc bulging, small central disc protrusion, and minimal uncovertebral spurring result in mild spinal stenosis and mild bilateral neural foraminal stenosis. C6-7: Broad-based posterior disc osteophyte complex including a large right paracentral and foraminal component results in mild spinal stenosis  with slight right ventral cord flattening and severe right and mild left neural foraminal stenosis. C7-T1: Negative. IMPRESSION: 1. Motion degraded examination without definite acute osseous abnormality or ligamentous injury identified. 2. 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 3. 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 4. Moderate spinal stenosis at C4-5 and milder spinal stenosis at the other levels. 5. Severe multilevel neural foraminal stenosis. Electronically Signed   By: Sebastian Ache M.D.   On: 06/23/2018 16:13   US Renal  Result Date: 06/25/2018 CLINICAL DATA:  Acute renal failure, history diabetes mellitus, CHF EXAM: RENAL / URINARY TRACT ULTRASOUND COMPLETE COMPARISON:  None Correlation: CTA chest 06/22/2018 FINDINGS: Right Kidney: Length: 9.0 cm. Normal cortical thickness and echogenicity. No mass, hydronephrosis or  shadowing calcification. Image quality degraded secondary to body habitus. Left Kidney: LEFT kidney not visualized sonographically, likely due to a combination of body habitus and bowel gas. The upper pole of a normal appearing LEFT kidney was identified on a recent CTA chest exam. Bladder: Decompressed, unable to evaluate. IMPRESSION: Grossly normal appearing RIGHT kidney. Sonographic nonvisualization of the LEFT kidney. Electronically Signed   By: Ulyses Southward M.D.   On: 06/25/2018 15:25   Ct Hip Left Wo Contrast  Result Date: 06/20/2018 CLINICAL DATA:  Pt to ED [facility] EMS from [facility] c/o unwitnessed fall today, patient states was transferring from wheelchair to bed when the wheelchair went backwards causing patient to fall into a door hitting his back and left hip. Denies hitting head or LOC EXAM: CT OF THE LEFT HIP WITHOUT CONTRAST TECHNIQUE: Multidetector CT imaging of the left hip was performed according to the standard protocol. Multiplanar CT image reconstructions were also generated. COMPARISON:  Current left hip radiographs. FINDINGS: Bones/Joint/Cartilage No fracture. There is a small collar of osteophytes along the base of the left femoral head accounting for the impression of a possible fracture on the standard radiographs. No bone lesion. Left hip joint is normally aligned. There is concentric hip joint space narrowing with greater narrowing along superolateral joint space. No convincing joint effusion. Ligaments Suboptimally assessed by CT. Muscles and Tendons The muscles and tendons inserting along the proximal appear intact. No muscle contusion or hematoma on the included field. Soft tissues No soft tissue mass or significant contusion or hematoma. No acute findings noted in the visualized portions of the left pelvis. IMPRESSION: 1. No fracture. Specifically, no evidence of a left femoral neck fracture. No acute findings. 2. Degenerative/arthropathic changes of the left hip joint with a small  collar osteophytes along the base of the femoral head. Electronically Signed   By: Amie Portland M.D.   On: 06/20/2018 19:54   Ct Angio Chest Aorta W/cm &/or Wo/cm  Result Date: 06/22/2018 CLINICAL DATA:  77 year old male with weakness and fall. EXAM: CT ANGIOGRAPHY CHEST WITH CONTRAST TECHNIQUE: Multidetector CT imaging of the chest was performed using the standard protocol during bolus administration of intravenous contrast. Multiplanar CT image reconstructions and MIPs were obtained to evaluate the vascular anatomy. CONTRAST:  75mL OMNIPAQUE IOHEXOL 350 MG/ML SOLN COMPARISON:  None. FINDINGS: Cardiovascular: This is a technically adequate study for evaluation of aortic aneurysm/dissection. This is technically borderline for evaluation of pulmonary emboli. Marked cardiomegaly identified. Coronary artery and aortic atherosclerotic calcifications noted without thoracic aortic aneurysm. Pacemaker identified. No pericardial effusion. No definite pulmonary emboli identified. Mediastinum/Nodes: No enlarged mediastinal, hilar, or axillary lymph nodes. Thyroid gland, trachea, and esophagus demonstrate no significant findings. Lungs/Pleura: A  small LEFT pleural effusion is noted. Bilateral LOWER lobe airspace disease/consolidation/atelectasis noted. Multiple calcified granulomas within the lungs noted. No evidence of pneumothorax. Upper Abdomen: Numerous heterogeneous enhancing round lesions within the spleen identified, the largest measuring 3 cm (series 5: Image 74). Musculoskeletal: No acute or suspicious bony abnormalities noted. Review of the MIP images confirms the above findings. IMPRESSION: 1. No evidence of thoracic aortic aneurysm/dissection or definite pulmonary emboli. No hematoma. 2. Bilateral LOWER lobe opacities which may represent airspace disease/pneumonia or atelectasis. Small LEFT pleural effusion. 3. Indeterminate enhancing splenic lesions. Dedicated CT or MRI of the abdomen/pelvis recommended. 4.  Marked cardiomegaly. 5. Coronary artery and Aortic Atherosclerosis (ICD10-I70.0). Electronically Signed   By: Harmon Pier M.D.   On: 06/22/2018 17:28   Dg Hip Unilat W Or Wo Pelvis 2-3 Views Left  Result Date: 06/20/2018 CLINICAL DATA:  Unwitnessed fall today while transferring from the wheelchair to bed with left hip pain. EXAM: DG HIP (WITH OR WITHOUT PELVIS) 2-3V LEFT COMPARISON:  None. FINDINGS: Mild diffuse osteopenia. Mild degenerative change of the left hip. There are subtle findings suggesting a subcapital left femoral neck fracture there are degenerative changes of the spine. Visualized portions of the right hip arthroplasty are intact. IMPRESSION: Subtle findings suggesting left subcapital femoral neck fracture. CT or MRI may be helpful for confirmation. Electronically Signed   By: Elberta Fortis M.D.   On: 06/20/2018 19:02    DISCHARGE EXAMINATION: See progress note from earlier today  DISPOSITION: Residential hospice  Discharge Instructions    Discharge instructions   Complete by:  As directed    You were cared for by a hospitalist during your hospital stay. If you have any questions about your discharge medications or the care you received while you were in the hospital after you are discharged, you can call the unit and asked to speak with the hospitalist on call if the hospitalist that took care of you is not available. Once you are discharged, your primary care physician will handle any further medical issues. Please note that NO REFILLS for any discharge medications will be authorized once you are discharged, as it is imperative that you return to your primary care physician (or establish a relationship with a primary care physician if you do not have one) for your aftercare needs so that they can reassess your need for medications and monitor your lab values. If you do not have a primary care physician, you can call [phone] for a physician referral.   Increase activity slowly    Complete by:  As directed         Allergies as of 06/26/2018      Reactions   Amiodarone    Warfarin And Related       Medication List    STOP taking these medications   allopurinol 300 MG tablet Commonly known as:  ZYLOPRIM   apixaban 2.5 MG Tabs tablet Commonly known as:  ELIQUIS   bumetanide 1 MG tablet Commonly known as:  BUMEX   bumetanide 2 MG tablet Commonly known as:  BUMEX   CERTAVITE SENIOR/ANTIOXIDANT Tabs   docusate sodium 100 MG capsule Commonly known as:  COLACE   glucosamine-chondroitin 500-400 MG tablet   insulin aspart 100 UNIT/ML injection Commonly known as:  novoLOG   insulin glargine 100 UNIT/ML injection Commonly known as:  LANTUS   lactulose 10 GM/15ML solution Commonly known as:  CHRONULAC   levothyroxine 175 MCG tablet Commonly known as:  SYNTHROID, LEVOTHROID   Melatonin  5 MG Tabs   polyethylene glycol packet Commonly known as:  MIRALAX / GLYCOLAX   pravastatin 10 MG tablet Commonly known as:  PRAVACHOL   senna 8.6 MG tablet Commonly known as:  SENOKOT   spironolactone 25 MG tablet Commonly known as:  ALDACTONE   sulfamethoxazole-trimethoprim 800-160 MG tablet Commonly known as:  BACTRIM DS,SEPTRA DS     TAKE these medications   acetaminophen 325 MG tablet Commonly known as:  TYLENOL Take 650 mg by mouth every 4 (four) hours as needed.   fentaNYL 100 MCG/2ML injection Commonly known as:  SUBLIMAZE Inject 0.5 mLs (25 mcg total) into the vein every hour as needed for moderate pain (dyspnea).   haloperidol lactate 5 MG/ML injection Commonly known as:  HALDOL Inject 0.4 mLs (2 mg total) into the vein every 6 (six) hours as needed.   metoprolol tartrate 50 MG tablet Commonly known as:  LOPRESSOR Take 1 tablet (50 mg total) by mouth 2 (two) times daily. What changed:    medication strength  how much to take   mupirocin ointment 2 % Commonly known as:  BACTROBAN Apply 1 application topically daily. Apply to  Affected area once daily   nystatin powder Generic drug:  nystatin Apply 1 g topically 2 (two) times daily.          TOTAL DISCHARGE TIME: 35 mins  Osvaldo Shipper  [facility] Pager [phone]  06/26/2018, 2:07 PM

## 2018-06-26 NOTE — Progress Notes (Signed)
Patient ID: Leonard Welch, male   DOB: Feb 24, 1941, 77 y.o.   MRN: [id number]  This NP visited patient at the bedside as a follow up palliative medicine needs and emotional support, wife and son at bedside   I met this patient and his wife last week at [facility].  Patient was transferred here to [facility] health last week secondary to bilateral upper extremity weakness, MRI of the spine on 10 4 showed spinal cord compression.  Surgery was being considered but unfortunately patient has continued to decompensate.  Patient is  encephalopathic, creatinine has risen to 5.7.  And he has had little urine output over the last 24 hours.  Prognosis is likely days to a week.  The family understands  the poor prognosis and hope  is for comfort, quality and dignity this known end-of-life time.     Plan of care -No further diagnostics or life prolonging measures -The IV antibiotics and minimize medications to focus on comfort only -Chips and sips  as tolerated  They hope for transition to hospice for in the [place] area, ASAP.    Symptoms  -Pain/dyspnea-fentanyl 25 mcg IV every 1 hour as needed  -Agitation-Haldol 2 mg IV every 6 hours as needed  Natural trajectory and expectations at end of life was discussed  Questions and concerns addressed   Discussed with Dr Maryland Pink and Vernell Morgans  Total time spent on the unit was 45 minutes  Greater than 50% of the time was spent in counseling and coordination of care  Leonard Lessen NP  Palliative Medicine Team Team Phone # [phone] Pager [phone]

## 2018-06-26 NOTE — Progress Notes (Signed)
Pt transported to [facility] via [facility] tranported, pt stable in stretcher, pt would flutter eyes to voice otherwise no response, no signs of pain, 02 2l via [place], report given to [facility] transport as well as Thayer Ohm at [facility], all questions entertained and answerd, advised chris of iv in right hand and left ac as requested, pouch  In place but no urine output today, advised we have given regular diet for pt confort foods per speech but does best with pudding consistency esp med administration,advised pacemaker in place, reviewed wounds on legs and current dressings

## 2018-06-28 LAB — CULTURE, BLOOD (ROUTINE X 2)
CULTURE: NO GROWTH
SPECIAL REQUESTS: ADEQUATE

## 2018-07-21 DEATH — deceased

## 2019-03-30 IMAGING — CT CT CERVICAL SPINE W/O CM
3 series · 13 of 33 positions shown, 16 images · non-contrast
Comparison: CT scan of June 21, 2018.

CLINICAL DATA: Bilateral upper extremity weakness after fall.

EXAM:
CT CERVICAL SPINE WITHOUT CONTRAST
TECHNIQUE: Multidetector CT imaging of the cervical spine was performed without
intravenous contrast. Multiplanar CT image reconstructions were also
generated.

[Series 3: c spine soft · axial · 0.37mm/px · z∈[+54,+198]mm · 5 of 104 slices shown, 7 images]
[im 16/104  soft-tissue]
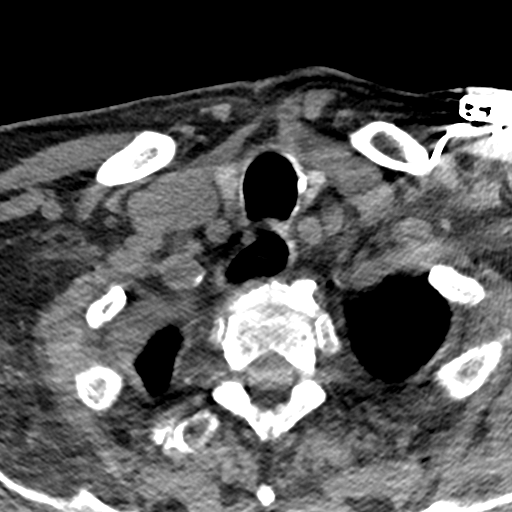
[im 16/104  bone]
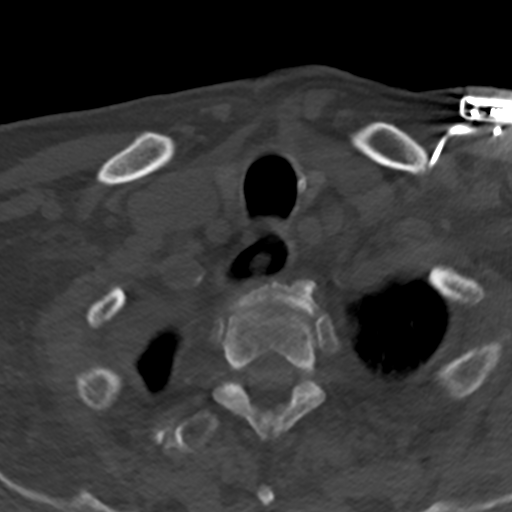
[im 32/104  bone]
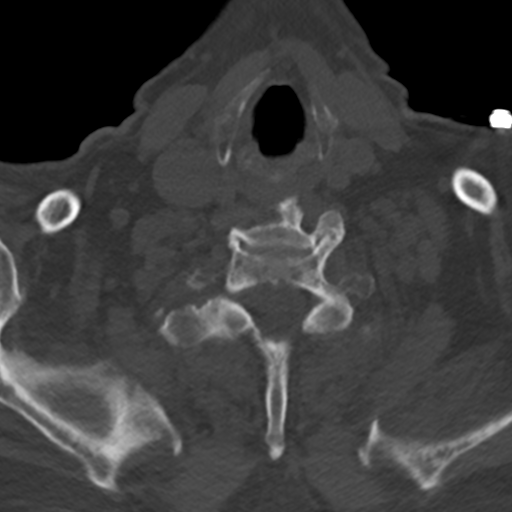
[im 56/104  bone]
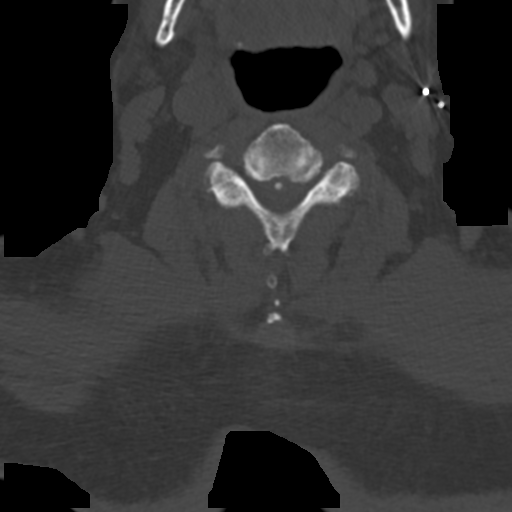
[im 72/104  bone]
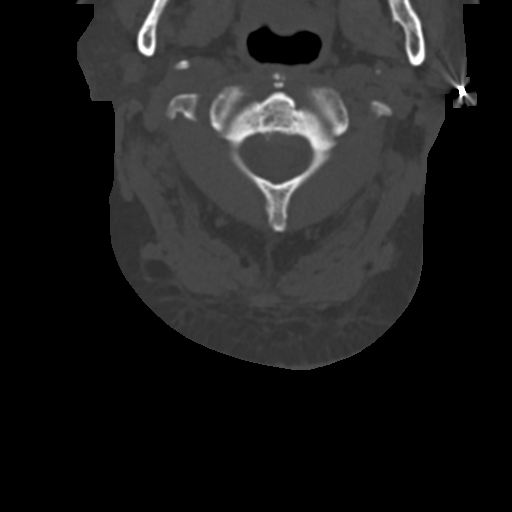
[im 88/104  soft-tissue]
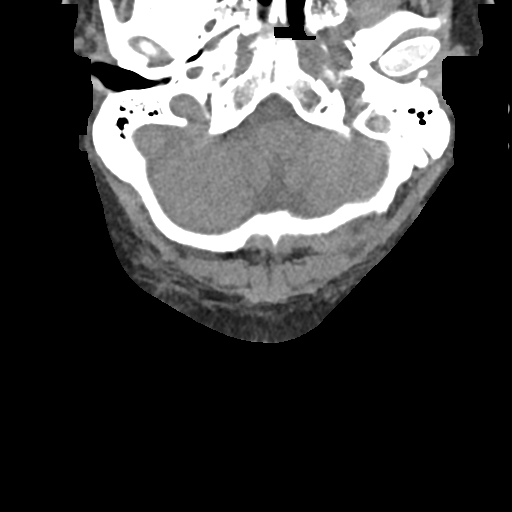
[im 88/104  bone]
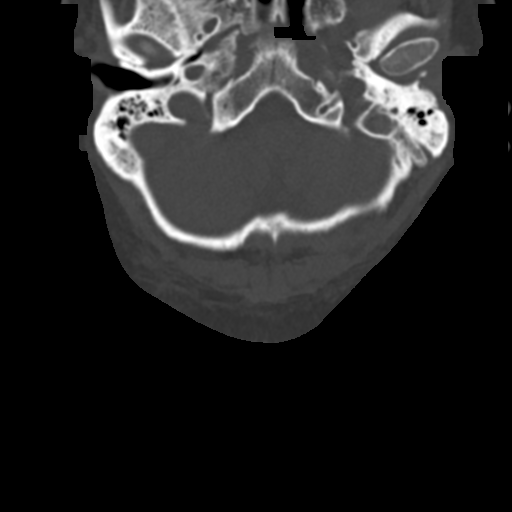

[Series 4: sagittal bone · sagittal · 0.34mm/px · 5 of 69 slices shown, 6 images]
[im 23/69  bone]
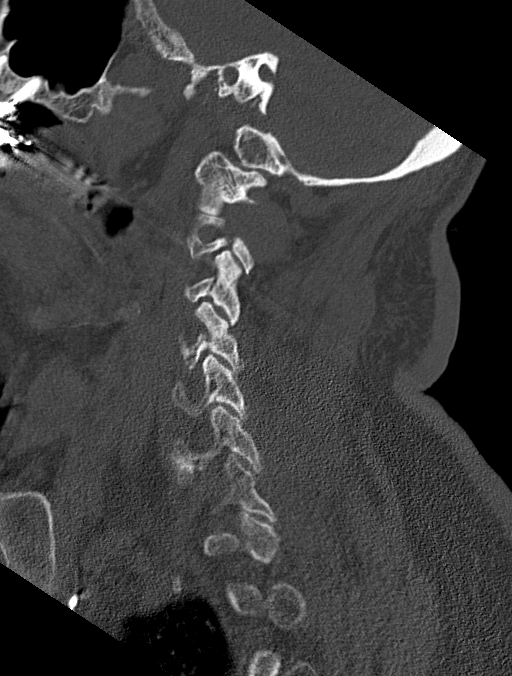
[im 29/69  bone]
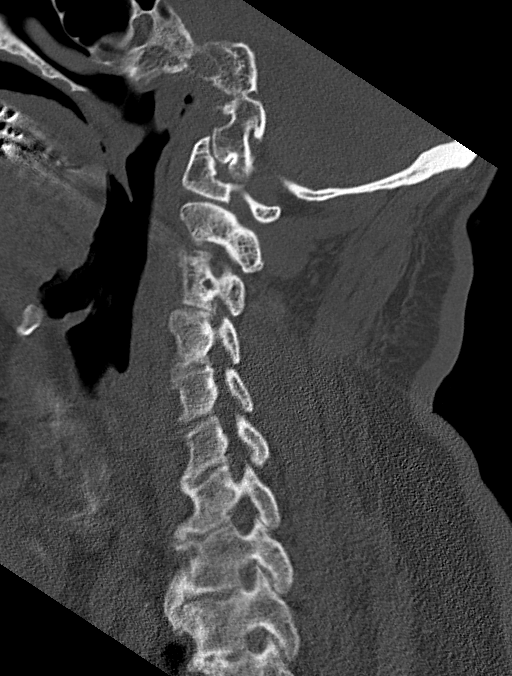
[im 35/69  soft-tissue]
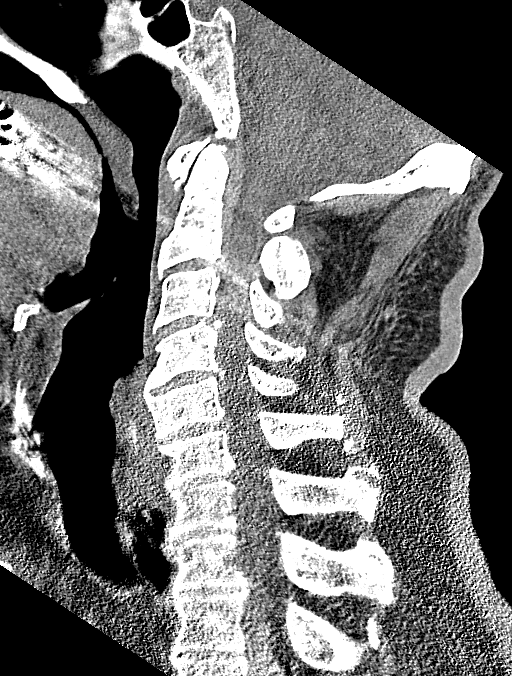
[im 35/69  bone]
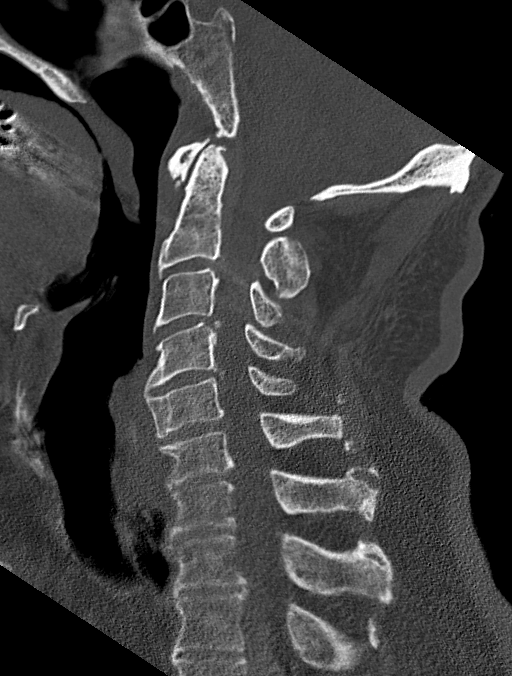
[im 40/69  bone]
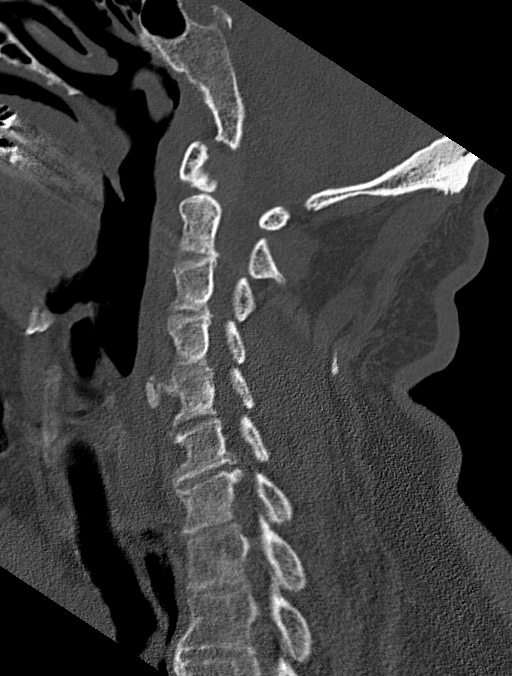
[im 46/69  bone]
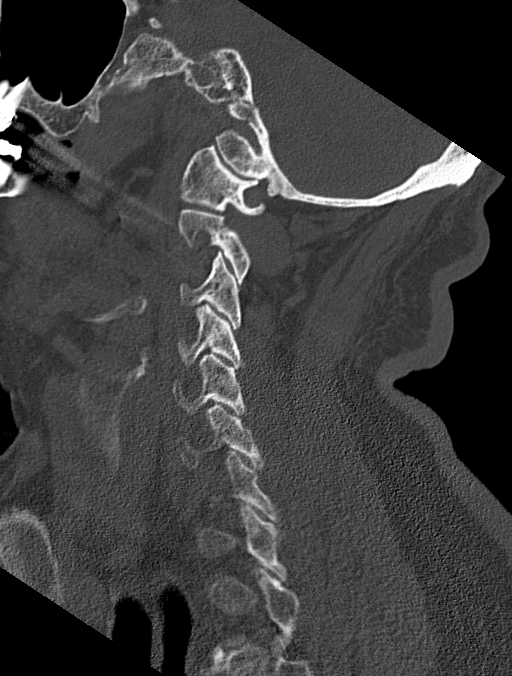

[Series 5: coronal bone · coronal · 0.29mm/px · 3 of 62 slices shown]
[im 18/62  bone]
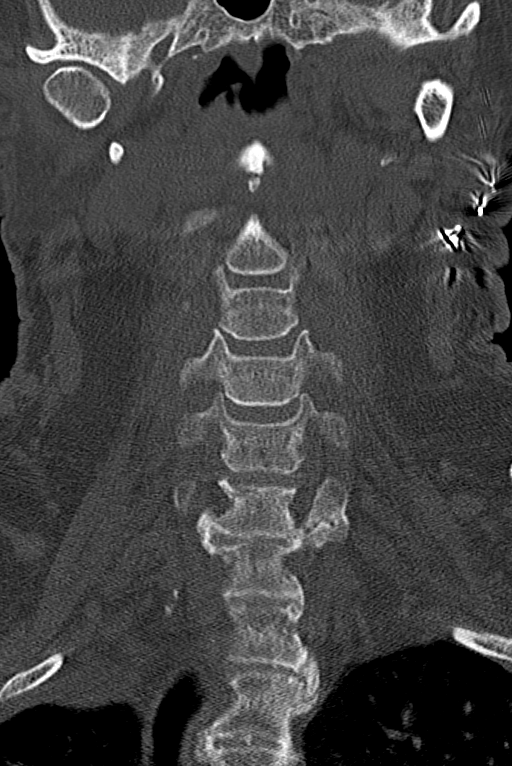
[im 27/62  bone]
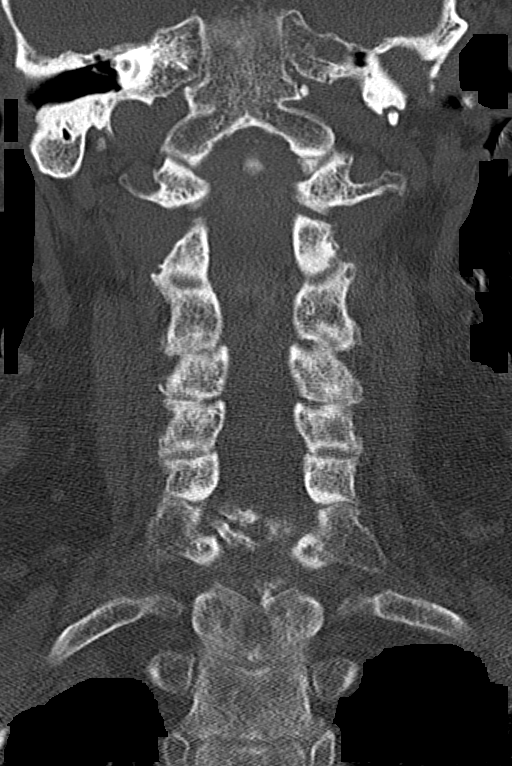
[im 35/62  bone]
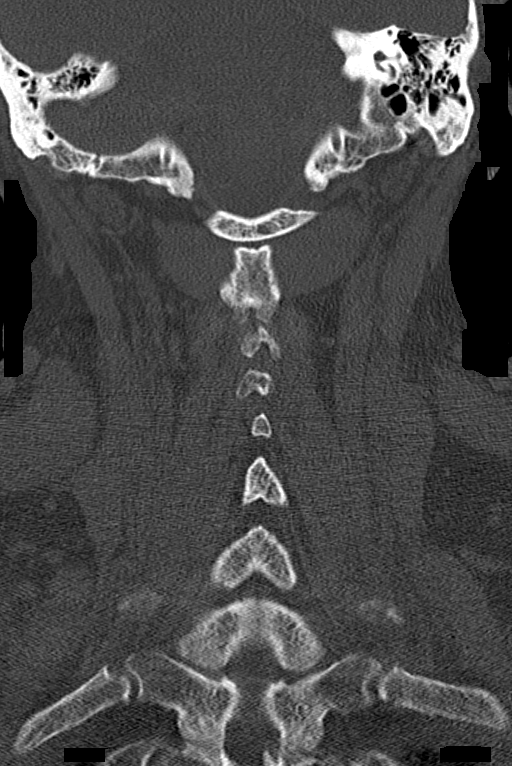

[13 of 33 positions shown; findings below may reference images not displayed]

FINDINGS: Alignment: Normal.

Skull base and vertebrae: No acute fracture. No primary bone lesion
or focal pathologic process.

Soft tissues and spinal canal: No prevertebral fluid or swelling. No
visible canal hematoma.

Disc levels: Moderate anterior osteophyte formation is noted at C4-5
and C5-6. Moderate degenerative disc disease is noted at C6-7 and
C7-T1. Mild to moderate right-sided neural foraminal stenosis is
noted at C6-7 secondary to uncovertebral spurring.

Upper chest: Negative.

Other: None.
IMPRESSION: No fracture or spondylolisthesis is noted in the cervical spine.
Multilevel degenerative disc disease is noted. Mild to moderate
right-sided neural foraminal stenosis is noted at C6-7 secondary to
uncovertebral spurring.

## 2020-01-16 IMAGING — US US RENAL
1 series · 14 of 19 positions shown · non-contrast
Comparison: None

Correlation: CTA chest 06/22/2018

CLINICAL DATA: Acute renal failure, history diabetes mellitus, CHF

EXAM:
RENAL / URINARY TRACT ULTRASOUND COMPLETE

[Series 1: us renal · 0.23mm/px · 14 of 19 slices shown]
[im 1/19]
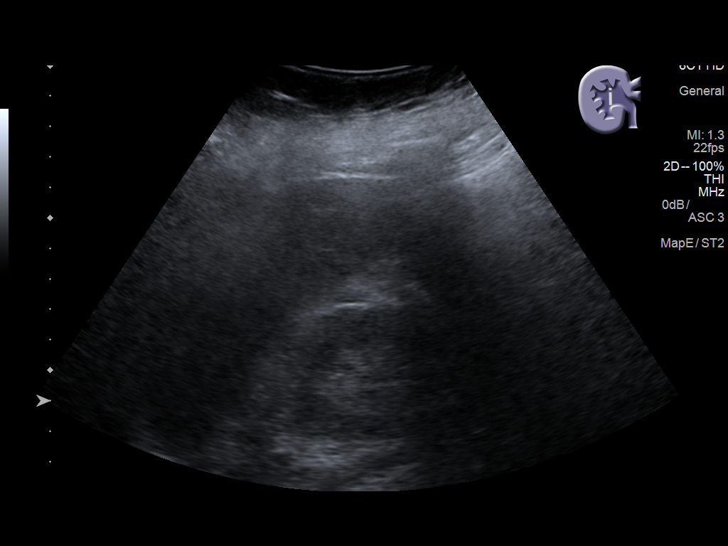
[im 3/19]
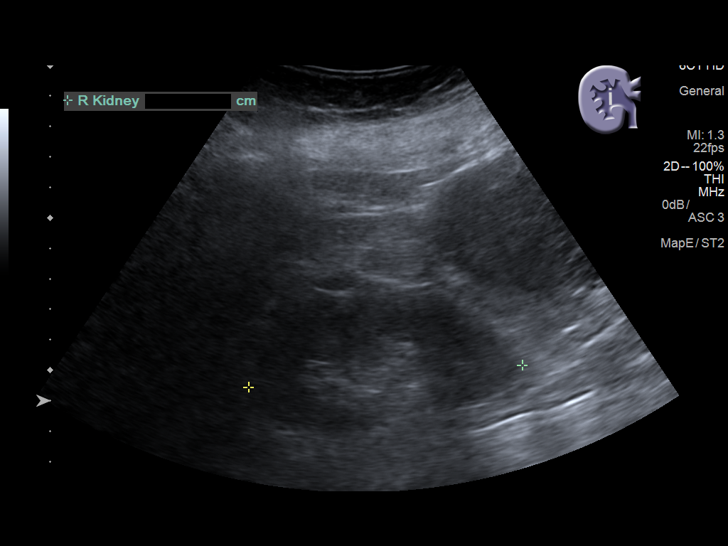
[im 4/19]
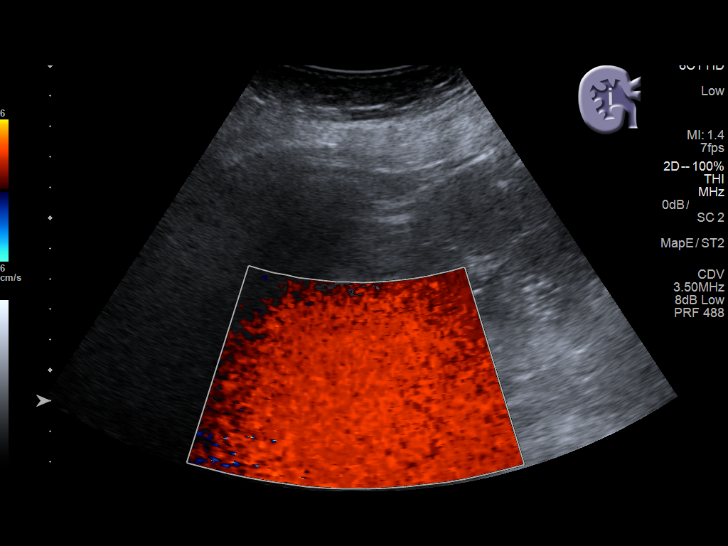
[im 5/19]
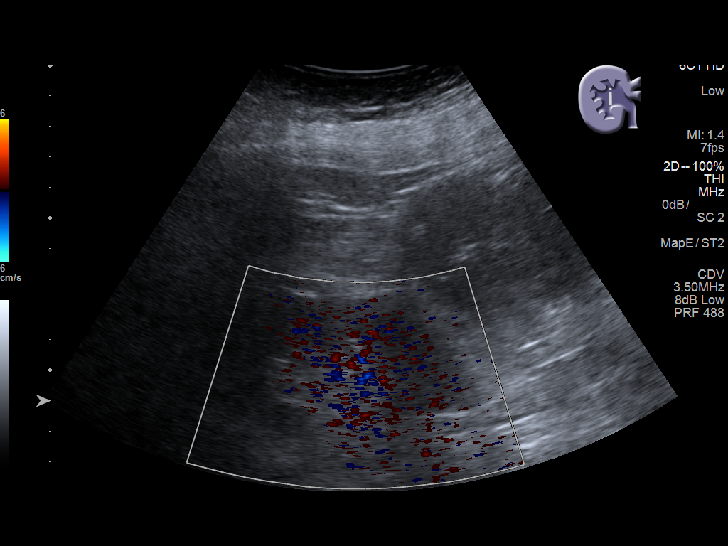
[im 7/19]
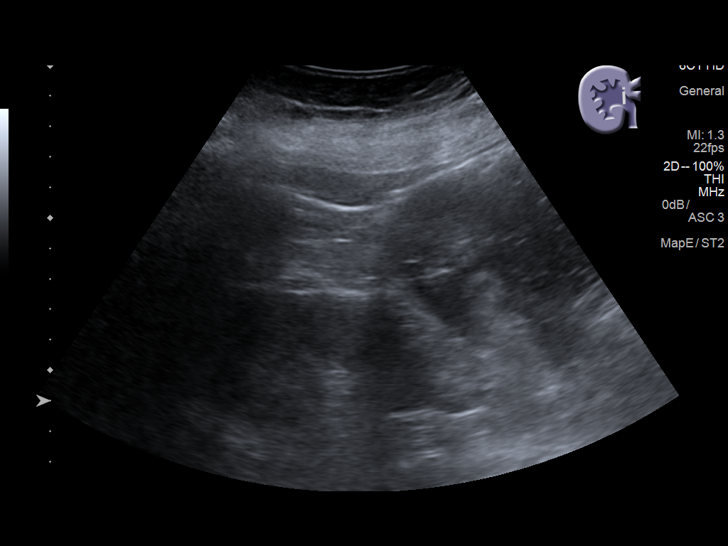
[im 8/19]
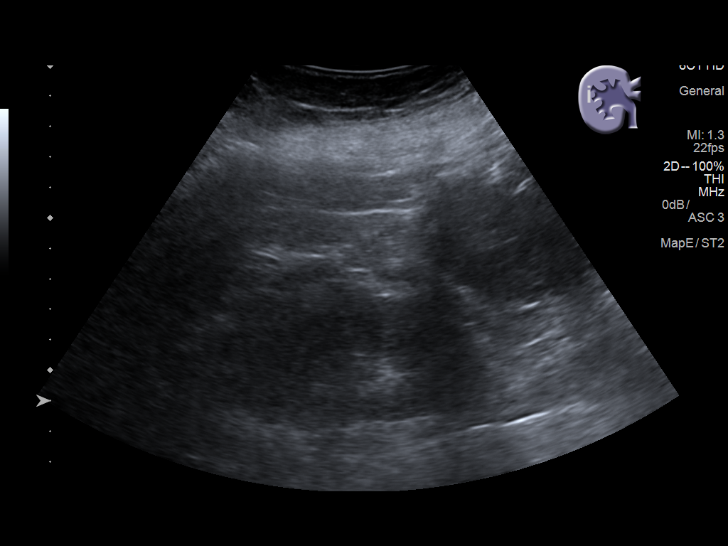
[im 9/19]
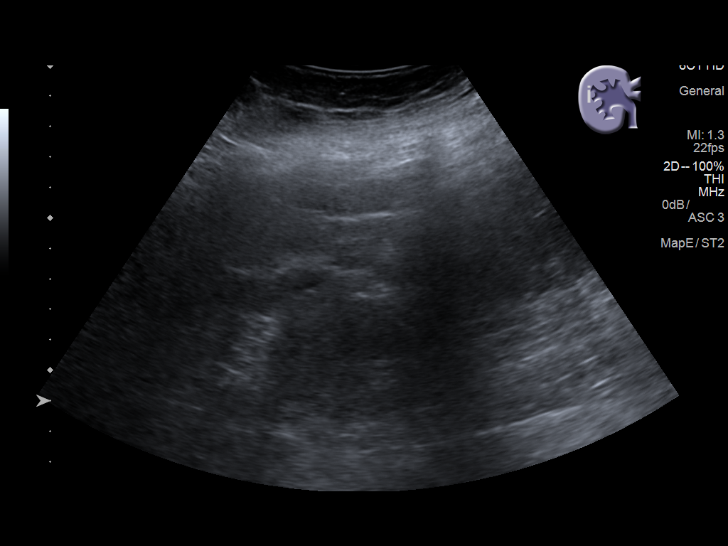
[im 11/19]
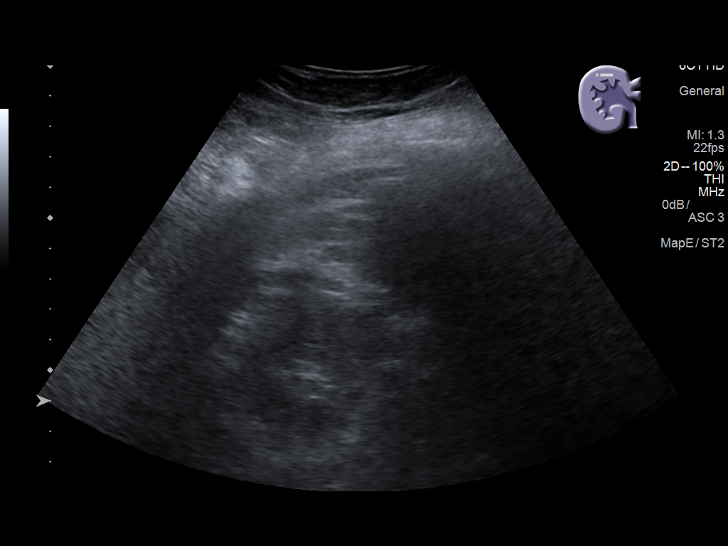
[im 12/19]
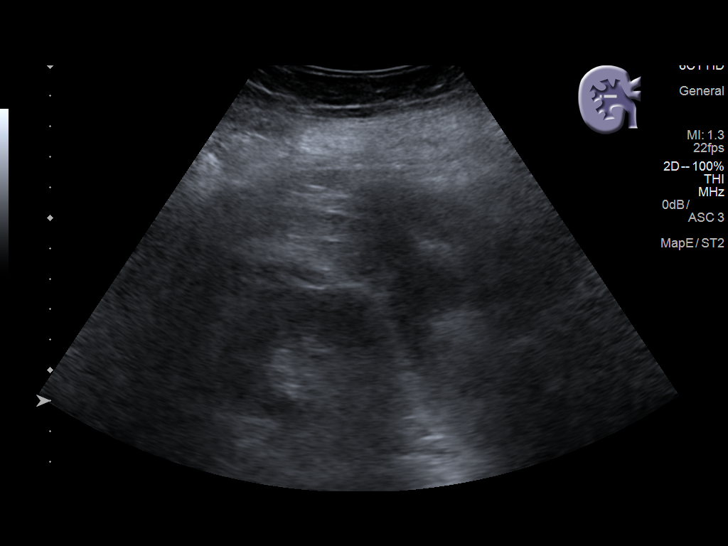
[im 13/19]
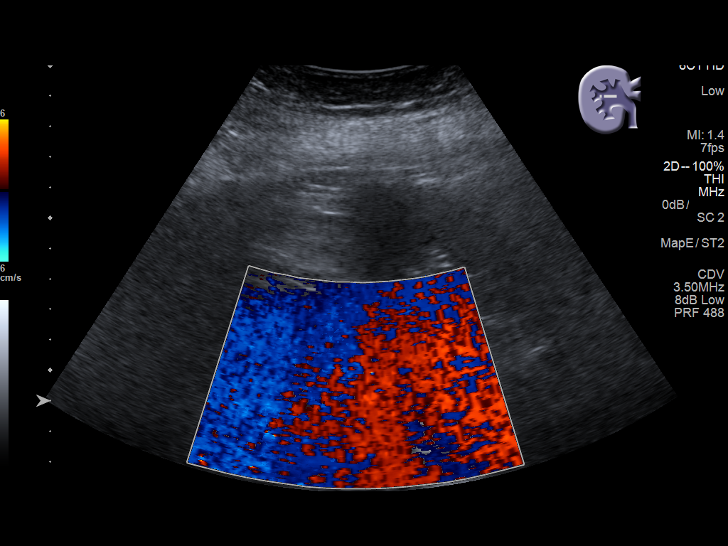
[im 15/19]
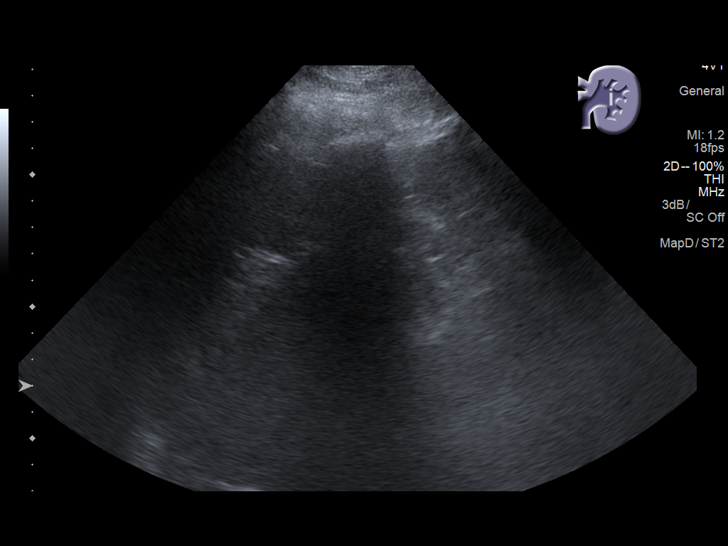
[im 16/19]
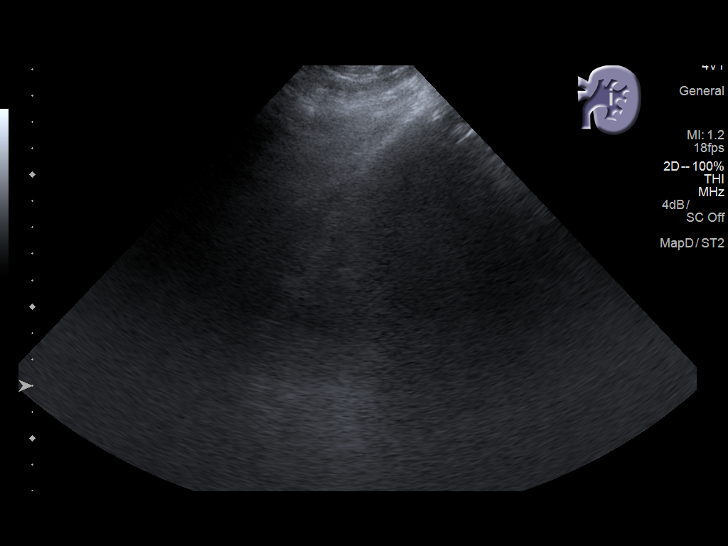
[im 17/19]
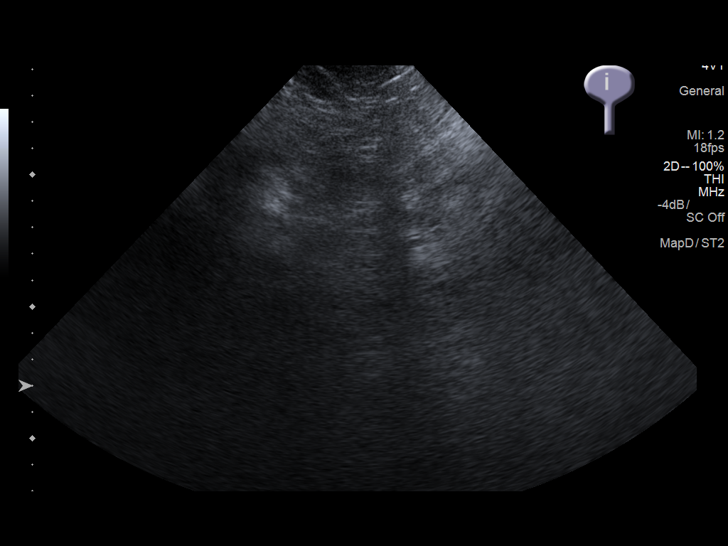
[im 19/19]
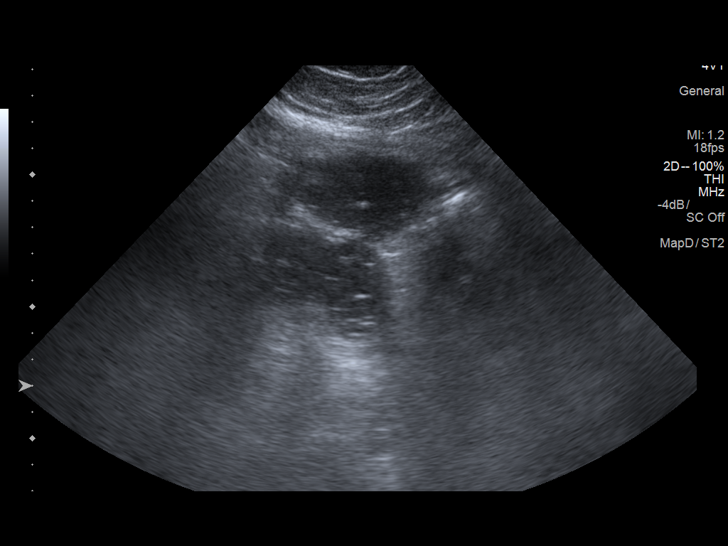

[14 of 19 positions shown; findings below may reference images not displayed]

FINDINGS: Right Kidney:

Length: 9.0 cm. Normal cortical thickness and echogenicity. No mass,
hydronephrosis or shadowing calcification. Image quality degraded
secondary to body habitus.

Left Kidney:

LEFT kidney not visualized sonographically, likely due to a
combination of body habitus and bowel gas. The upper pole of a
normal appearing LEFT kidney was identified on a recent CTA chest
exam.

Bladder:

Decompressed, unable to evaluate.
IMPRESSION: Grossly normal appearing RIGHT kidney.

Sonographic nonvisualization of the LEFT kidney.
